# Patient Record
Sex: Male | Born: 1950
Health system: Southern US, Community
[De-identification: ages and names within clinical notes are randomized; demographics above are authoritative.]

## PROBLEM LIST (undated history)

## (undated) DIAGNOSIS — K5900 Constipation, unspecified: Secondary | ICD-10-CM

## (undated) DIAGNOSIS — I10 Essential (primary) hypertension: Secondary | ICD-10-CM

## (undated) DIAGNOSIS — K449 Diaphragmatic hernia without obstruction or gangrene: Secondary | ICD-10-CM

## (undated) DIAGNOSIS — H35349 Macular cyst, hole, or pseudohole, unspecified eye: Secondary | ICD-10-CM

## (undated) DIAGNOSIS — M1990 Unspecified osteoarthritis, unspecified site: Secondary | ICD-10-CM

## (undated) DIAGNOSIS — S93699A Other sprain of unspecified foot, initial encounter: Secondary | ICD-10-CM

## (undated) DIAGNOSIS — C801 Malignant (primary) neoplasm, unspecified: Secondary | ICD-10-CM

## (undated) DIAGNOSIS — I2510 Atherosclerotic heart disease of native coronary artery without angina pectoris: Secondary | ICD-10-CM

## (undated) DIAGNOSIS — E119 Type 2 diabetes mellitus without complications: Secondary | ICD-10-CM

## (undated) DIAGNOSIS — E7800 Pure hypercholesterolemia, unspecified: Secondary | ICD-10-CM

## (undated) HISTORY — DX: Type 2 diabetes mellitus without complications: E11.9

## (undated) HISTORY — PX: VASECTOMY: SHX75

## (undated) HISTORY — DX: Macular cyst, hole, or pseudohole, unspecified eye: H35.349

## (undated) HISTORY — DX: Atherosclerotic heart disease of native coronary artery without angina pectoris: I25.10

## (undated) HISTORY — PX: CARDIAC CATHETERIZATION: SHX172

## (undated) HISTORY — DX: Malignant (primary) neoplasm, unspecified: C80.1

## (undated) HISTORY — DX: Pure hypercholesterolemia, unspecified: E78.00

## (undated) HISTORY — DX: Essential (primary) hypertension: I10

## (undated) HISTORY — DX: Diaphragmatic hernia without obstruction or gangrene: K44.9

## (undated) HISTORY — DX: Other sprain of unspecified foot, initial encounter: S93.699A

## (undated) HISTORY — PX: APPENDECTOMY: SHX54

## (undated) HISTORY — PX: COLONOSCOPY: SHX174

---

## 1998-02-15 ENCOUNTER — Ambulatory Visit (HOSPITAL_COMMUNITY): Admission: RE | Admit: 1998-02-15 | Discharge: 1998-02-15 | Payer: Self-pay | Admitting: *Deleted

## 1998-02-15 ENCOUNTER — Encounter: Payer: Self-pay | Admitting: Surgery

## 1998-02-15 HISTORY — PX: CORONARY ARTERY BYPASS GRAFT: SHX141

## 1998-02-21 ENCOUNTER — Encounter: Payer: Self-pay | Admitting: Surgery

## 1998-02-21 ENCOUNTER — Inpatient Hospital Stay (HOSPITAL_COMMUNITY): Admission: RE | Admit: 1998-02-21 | Discharge: 1998-02-25 | Payer: Self-pay | Admitting: Surgery

## 1998-02-22 ENCOUNTER — Encounter: Payer: Self-pay | Admitting: Surgery

## 1998-02-23 ENCOUNTER — Encounter: Payer: Self-pay | Admitting: Surgery

## 1998-03-27 ENCOUNTER — Encounter (HOSPITAL_COMMUNITY): Admission: RE | Admit: 1998-03-27 | Discharge: 1998-06-25 | Payer: Self-pay | Admitting: Cardiology

## 2003-04-04 ENCOUNTER — Ambulatory Visit (HOSPITAL_COMMUNITY): Admission: RE | Admit: 2003-04-04 | Discharge: 2003-04-04 | Payer: Self-pay | Admitting: Neurosurgery

## 2003-04-06 ENCOUNTER — Encounter: Admission: RE | Admit: 2003-04-06 | Discharge: 2003-04-06 | Payer: Self-pay | Admitting: Family Medicine

## 2003-04-11 ENCOUNTER — Inpatient Hospital Stay (HOSPITAL_COMMUNITY): Admission: RE | Admit: 2003-04-11 | Discharge: 2003-04-12 | Payer: Self-pay | Admitting: Neurosurgery

## 2003-05-25 ENCOUNTER — Ambulatory Visit (HOSPITAL_COMMUNITY): Admission: RE | Admit: 2003-05-25 | Discharge: 2003-05-25 | Payer: Self-pay | Admitting: Family Medicine

## 2006-01-05 ENCOUNTER — Encounter: Admission: RE | Admit: 2006-01-05 | Discharge: 2006-01-05 | Payer: Self-pay | Admitting: Family Medicine

## 2010-08-19 ENCOUNTER — Other Ambulatory Visit: Payer: Self-pay | Admitting: Cardiology

## 2010-08-19 MED ORDER — TRANDOLAPRIL 2 MG PO TABS: 2.0000 mg | ORAL_TABLET | Freq: Every day | ORAL | Status: DC

## 2010-08-19 MED ORDER — ATORVASTATIN CALCIUM 80 MG PO TABS: 80.0000 mg | ORAL_TABLET | Freq: Every day | ORAL | Status: DC

## 2010-08-19 NOTE — Telephone Encounter (Signed)
Called wanting his prescriptions to filled with [facility] instead of his local pharmacy because he switched insurances and [facility] fills his prescriptions for 90 days. Please call back. I have pulled the chart.

## 2010-08-19 NOTE — Telephone Encounter (Signed)
Called requesting refills on maviik and lipitor. Sent to [facility]

## 2010-09-30 ENCOUNTER — Ambulatory Visit: Payer: Self-pay | Admitting: Cardiology

## 2010-10-21 ENCOUNTER — Encounter: Payer: Self-pay | Admitting: Cardiology

## 2010-10-29 ENCOUNTER — Ambulatory Visit (INDEPENDENT_AMBULATORY_CARE_PROVIDER_SITE_OTHER): Payer: 59 | Admitting: Cardiology

## 2010-10-29 ENCOUNTER — Encounter: Payer: Self-pay | Admitting: Cardiology

## 2010-10-29 DIAGNOSIS — E119 Type 2 diabetes mellitus without complications: Secondary | ICD-10-CM | POA: Insufficient documentation

## 2010-10-29 DIAGNOSIS — I10 Essential (primary) hypertension: Secondary | ICD-10-CM

## 2010-10-29 DIAGNOSIS — I2510 Atherosclerotic heart disease of native coronary artery without angina pectoris: Secondary | ICD-10-CM

## 2010-10-29 DIAGNOSIS — E7800 Pure hypercholesterolemia, unspecified: Secondary | ICD-10-CM

## 2010-10-29 NOTE — Progress Notes (Signed)
   Kevin Wheeler Date of Birth: 10/13/1950   History of Present Illness: Kevin Wheeler is seen today for yearly followup. He states he has done very well over the past year. He denies any symptoms of chest pain or shortness of breath. He just returned from a 3-1/2 week vacation to [place]. He did a lot of walking there and had no complaints. He has had no other significant medical problems this past year.  Current Outpatient Prescriptions on File Prior to Visit  Medication Sig Dispense Refill  . aspirin 81 MG tablet Take 81 mg by mouth daily.        Marland Kitchen atorvastatin (LIPITOR) 80 MG tablet Take 1 tablet (80 mg total) by mouth daily.  90 tablet  3  . trandolapril (MAVIK) 2 MG tablet Take 1 tablet (2 mg total) by mouth daily.  90 tablet  3  . ezetimibe (ZETIA) 10 MG tablet Take 10 mg by mouth daily.          No Known Allergies  Past Medical History  Diagnosis Date  . Coronary artery disease   . Hypertension   . Hypercholesteremia   . Diabetes mellitus   . Hiatal hernia   . Cancer     Skin Cancers    Past Surgical History  Procedure Date  . Colonoscopy   . Appendectomy   . Vasectomy   . Coronary artery bypass graft February 15, 1998    Included an LIMA graft to the LAD, saphenous vein graft sequentially to the intermediate and obtuse marginal vessels, and a sequential vein graft to the acute marginal and distal circumflex. There is also a vein graft to the diagonal  . Cardiac catheterization     Ejection fraction is estimated at 60%    History  Smoking status  . Never Smoker   Smokeless tobacco  . Not on file    History  Alcohol Use No    Family History  Problem Relation Age of Onset  . Heart disease Father     Review of Systems: The review of systems is positive for mild weight gain.  All other systems were reviewed and are negative.  Physical Exam: BP 126/82  Ht 5\' 9"  (1.753 m)  Wt 195 lb 3.2 oz (88.542 kg)  BMI 28.83 kg/m2 The patient is alert and oriented x 3.  The  mood and affect are normal.  The skin is warm and dry.  Color is normal.  The HEENT exam reveals that the sclera are nonicteric.  The mucous membranes are moist.  The carotids are 2+ without bruits.  There is no thyromegaly.  There is no JVD.  The lungs are clear.  The chest wall is non tender.  The heart exam reveals a regular rate with a normal S1 and S2.  There are no murmurs, gallops, or rubs.  The PMI is not displaced.   Abdominal exam reveals good bowel sounds.  There is no guarding or rebound.  There is no hepatosplenomegaly or tenderness.  There are no masses.  Exam of the legs reveal no clubbing, cyanosis, or edema.  The legs are without rashes.  The distal pulses are intact.  Cranial nerves II - XII are intact.  Motor and sensory functions are intact.  The gait is normal. LABORATORY DATA: ECG today is normal.  Assessment / Plan:

## 2010-10-29 NOTE — Assessment & Plan Note (Signed)
Blood pressure is under excellent control today. We'll continue his current therapy.

## 2010-10-29 NOTE — Patient Instructions (Signed)
Continue your current medications.  I will get a copy of your lab from Dr. Clovis Riley.  I will see you again in 1 year.

## 2010-10-29 NOTE — Assessment & Plan Note (Signed)
Will obtain a copy of his most recent lab work from Dr. Clovis Riley. He will continue on Lipitor therapy.

## 2010-10-29 NOTE — Assessment & Plan Note (Signed)
His last stress test from a year ago was normal. He is asymptomatic. We will continue his risk factor modification and followup again in one year.

## 2010-10-30 ENCOUNTER — Encounter: Payer: Self-pay | Admitting: Cardiology

## 2011-03-10 ENCOUNTER — Encounter: Payer: Self-pay | Admitting: Cardiology

## 2011-10-20 ENCOUNTER — Other Ambulatory Visit: Payer: Self-pay | Admitting: Neurosurgery

## 2011-10-20 DIAGNOSIS — M542 Cervicalgia: Secondary | ICD-10-CM

## 2011-10-21 ENCOUNTER — Ambulatory Visit
Admission: RE | Admit: 2011-10-21 | Discharge: 2011-10-21 | Disposition: A | Discharge status: Home / Self Care | Payer: 59 | Source: Ambulatory Visit | Attending: Neurosurgery | Admitting: Neurosurgery

## 2011-10-21 VITALS — BP 119/81 | HR 73

## 2011-10-21 DIAGNOSIS — M542 Cervicalgia: Secondary | ICD-10-CM

## 2011-10-21 MED ORDER — DIAZEPAM 5 MG PO TABS
10.0000 mg | ORAL_TABLET | Freq: Once | ORAL | Status: AC
  Administered 2011-10-21: 10 mg via ORAL

## 2011-10-21 MED ORDER — IOHEXOL 300 MG/ML  SOLN
10.0000 mL | Freq: Once | INTRAMUSCULAR | Status: AC | PRN
  Administered 2011-10-21: 10 mL via INTRATHECAL

## 2011-10-23 ENCOUNTER — Encounter (HOSPITAL_COMMUNITY): Payer: Self-pay | Admitting: Pharmacy Technician

## 2011-10-23 ENCOUNTER — Other Ambulatory Visit: Payer: Self-pay | Admitting: Neurosurgery

## 2011-10-23 ENCOUNTER — Encounter (HOSPITAL_COMMUNITY): Payer: Self-pay | Admitting: *Deleted

## 2011-10-24 ENCOUNTER — Encounter (HOSPITAL_COMMUNITY): Payer: Self-pay | Admitting: Anesthesiology

## 2011-10-24 ENCOUNTER — Encounter (HOSPITAL_COMMUNITY)
Admission: RE | Disposition: A | Discharge status: Home / Self Care | Payer: Self-pay | Source: Ambulatory Visit | Attending: Neurosurgery

## 2011-10-24 ENCOUNTER — Observation Stay (HOSPITAL_COMMUNITY)
Admission: RE | Admit: 2011-10-24 | Discharge: 2011-10-25 | DRG: 030 | Disposition: A | Discharge status: Home / Self Care | Payer: 59 | Source: Ambulatory Visit | Attending: Neurosurgery | Admitting: Neurosurgery

## 2011-10-24 ENCOUNTER — Ambulatory Visit (HOSPITAL_COMMUNITY): Payer: 59

## 2011-10-24 ENCOUNTER — Ambulatory Visit (HOSPITAL_COMMUNITY): Payer: 59 | Admitting: Anesthesiology

## 2011-10-24 ENCOUNTER — Other Ambulatory Visit: Payer: Self-pay | Admitting: Cardiology

## 2011-10-24 DIAGNOSIS — Q762 Congenital spondylolisthesis: Principal | ICD-10-CM | POA: Insufficient documentation

## 2011-10-24 DIAGNOSIS — I2510 Atherosclerotic heart disease of native coronary artery without angina pectoris: Secondary | ICD-10-CM | POA: Insufficient documentation

## 2011-10-24 DIAGNOSIS — Z951 Presence of aortocoronary bypass graft: Secondary | ICD-10-CM | POA: Insufficient documentation

## 2011-10-24 DIAGNOSIS — M47812 Spondylosis without myelopathy or radiculopathy, cervical region: Secondary | ICD-10-CM | POA: Insufficient documentation

## 2011-10-24 DIAGNOSIS — Z981 Arthrodesis status: Secondary | ICD-10-CM | POA: Insufficient documentation

## 2011-10-24 DIAGNOSIS — I10 Essential (primary) hypertension: Secondary | ICD-10-CM | POA: Insufficient documentation

## 2011-10-24 DIAGNOSIS — E119 Type 2 diabetes mellitus without complications: Secondary | ICD-10-CM | POA: Insufficient documentation

## 2011-10-24 HISTORY — DX: Unspecified osteoarthritis, unspecified site: M19.90

## 2011-10-24 HISTORY — DX: Constipation, unspecified: K59.00

## 2011-10-24 HISTORY — PX: ANTERIOR CERVICAL DECOMP/DISCECTOMY FUSION: SHX1161

## 2011-10-24 LAB — CBC
HCT: 45.1 % (ref 39.0–52.0)
Hemoglobin: 15.6 g/dL (ref 13.0–17.0)
MCH: 31.4 pg (ref 26.0–34.0)
MCHC: 34.6 g/dL (ref 30.0–36.0)
RDW: 13.4 % (ref 11.5–15.5)

## 2011-10-24 LAB — BASIC METABOLIC PANEL
BUN: 29 mg/dL — ABNORMAL HIGH (ref 6–23)
Chloride: 99 mEq/L (ref 96–112)
Creatinine, Ser: 1.04 mg/dL (ref 0.50–1.35)
GFR calc Af Amer: 86 mL/min — ABNORMAL LOW (ref >90)
GFR calc non Af Amer: 74 mL/min — ABNORMAL LOW (ref >90)
Glucose, Bld: 170 mg/dL — ABNORMAL HIGH (ref 70–99)
Potassium: 3.7 mEq/L (ref 3.5–5.1)

## 2011-10-24 LAB — GLUCOSE, CAPILLARY: Glucose-Capillary: 149 mg/dL — ABNORMAL HIGH (ref 70–99)

## 2011-10-24 SURGERY — ANTERIOR CERVICAL DECOMPRESSION/DISCECTOMY FUSION 1 LEVEL
Anesthesia: General | Site: Neck

## 2011-10-24 MED ORDER — MUPIROCIN 2 % EX OINT
TOPICAL_OINTMENT | Freq: Two times a day (BID) | CUTANEOUS | Status: DC
  Filled 2011-10-24: qty 22

## 2011-10-24 MED ORDER — HYDROMORPHONE HCL PF 1 MG/ML IJ SOLN
INTRAMUSCULAR | Status: AC
  Administered 2011-10-24: 0.5 mg via INTRAVENOUS
  Filled 2011-10-24: qty 1

## 2011-10-24 MED ORDER — ACETAMINOPHEN 325 MG PO TABS: 650.0000 mg | ORAL_TABLET | ORAL | Status: DC | PRN

## 2011-10-24 MED ORDER — NEOSTIGMINE METHYLSULFATE 1 MG/ML IJ SOLN
INTRAMUSCULAR | Status: DC | PRN
  Administered 2011-10-24: 2.5 mg via INTRAVENOUS

## 2011-10-24 MED ORDER — THROMBIN 5000 UNITS EX SOLR
OROMUCOSAL | Status: DC | PRN
  Administered 2011-10-24: 20:00:00 via TOPICAL

## 2011-10-24 MED ORDER — DEXAMETHASONE 4 MG PO TABS
4.0000 mg | ORAL_TABLET | Freq: Four times a day (QID) | ORAL | Status: DC
  Administered 2011-10-25 (×3): 4 mg via ORAL
  Filled 2011-10-24 (×6): qty 1

## 2011-10-24 MED ORDER — GLYCOPYRROLATE 0.2 MG/ML IJ SOLN
INTRAMUSCULAR | Status: DC | PRN
  Administered 2011-10-24: .5 mg via INTRAVENOUS

## 2011-10-24 MED ORDER — SODIUM CHLORIDE 0.9 % IV SOLN: INTRAVENOUS | Status: DC

## 2011-10-24 MED ORDER — ROCURONIUM BROMIDE 100 MG/10ML IV SOLN
INTRAVENOUS | Status: DC | PRN
  Administered 2011-10-24: 10 mg via INTRAVENOUS
  Administered 2011-10-24: 50 mg via INTRAVENOUS
  Administered 2011-10-24 (×2): 10 mg via INTRAVENOUS

## 2011-10-24 MED ORDER — DIAZEPAM 5 MG PO TABS: 5.0000 mg | ORAL_TABLET | Freq: Four times a day (QID) | ORAL | Status: DC | PRN

## 2011-10-24 MED ORDER — ATORVASTATIN CALCIUM 80 MG PO TABS
80.0000 mg | ORAL_TABLET | Freq: Every day | ORAL | Status: DC
  Filled 2011-10-24 (×3): qty 1

## 2011-10-24 MED ORDER — FENTANYL CITRATE 0.05 MG/ML IJ SOLN
INTRAMUSCULAR | Status: DC | PRN
  Administered 2011-10-24 (×6): 50 ug via INTRAVENOUS
  Administered 2011-10-24: 100 ug via INTRAVENOUS

## 2011-10-24 MED ORDER — OXYCODONE HCL 5 MG/5ML PO SOLN: 5.0000 mg | Freq: Once | ORAL | Status: DC | PRN

## 2011-10-24 MED ORDER — ONDANSETRON HCL 4 MG/2ML IJ SOLN
INTRAMUSCULAR | Status: DC | PRN
  Administered 2011-10-24: 4 mg via INTRAVENOUS

## 2011-10-24 MED ORDER — CEFAZOLIN SODIUM-DEXTROSE 2-3 GM-% IV SOLR
INTRAVENOUS | Status: DC | PRN
  Administered 2011-10-24: 2 g via INTRAVENOUS

## 2011-10-24 MED ORDER — CEFAZOLIN SODIUM-DEXTROSE 2-3 GM-% IV SOLR
INTRAVENOUS | Status: AC
  Filled 2011-10-24: qty 50

## 2011-10-24 MED ORDER — MUPIROCIN 2 % EX OINT
TOPICAL_OINTMENT | CUTANEOUS | Status: AC
  Administered 2011-10-24: 1 via NASAL
  Filled 2011-10-24: qty 22

## 2011-10-24 MED ORDER — ACETAMINOPHEN 650 MG RE SUPP: 650.0000 mg | RECTAL | Status: DC | PRN

## 2011-10-24 MED ORDER — LIDOCAINE HCL (CARDIAC) 20 MG/ML IV SOLN
INTRAVENOUS | Status: DC | PRN
  Administered 2011-10-24: 70 mg via INTRAVENOUS

## 2011-10-24 MED ORDER — CEFAZOLIN SODIUM 1-5 GM-% IV SOLN
1.0000 g | Freq: Three times a day (TID) | INTRAVENOUS | Status: AC
  Administered 2011-10-25 (×2): 1 g via INTRAVENOUS
  Filled 2011-10-24 (×2): qty 50

## 2011-10-24 MED ORDER — DEXAMETHASONE SODIUM PHOSPHATE 4 MG/ML IJ SOLN
4.0000 mg | Freq: Four times a day (QID) | INTRAMUSCULAR | Status: DC
  Filled 2011-10-24 (×4): qty 1

## 2011-10-24 MED ORDER — HYDROMORPHONE HCL PF 1 MG/ML IJ SOLN
0.2500 mg | INTRAMUSCULAR | Status: DC | PRN
  Administered 2011-10-24 (×2): 0.5 mg via INTRAVENOUS

## 2011-10-24 MED ORDER — 0.9 % SODIUM CHLORIDE (POUR BTL) OPTIME
TOPICAL | Status: DC | PRN
  Administered 2011-10-24: 1000 mL

## 2011-10-24 MED ORDER — SODIUM CHLORIDE 0.9 % IJ SOLN: 3.0000 mL | INTRAMUSCULAR | Status: DC | PRN

## 2011-10-24 MED ORDER — DEXAMETHASONE SODIUM PHOSPHATE 4 MG/ML IJ SOLN
INTRAMUSCULAR | Status: DC | PRN
  Administered 2011-10-24 (×2): 4 mg via INTRAVENOUS

## 2011-10-24 MED ORDER — PROPOFOL 10 MG/ML IV EMUL
INTRAVENOUS | Status: DC | PRN
  Administered 2011-10-24: 200 mg via INTRAVENOUS

## 2011-10-24 MED ORDER — OXYCODONE-ACETAMINOPHEN 5-325 MG PO TABS: 1.0000 | ORAL_TABLET | ORAL | Status: DC | PRN

## 2011-10-24 MED ORDER — ZOLPIDEM TARTRATE 10 MG PO TABS: 10.0000 mg | ORAL_TABLET | Freq: Every evening | ORAL | Status: DC | PRN

## 2011-10-24 MED ORDER — LACTATED RINGERS IV SOLN
INTRAVENOUS | Status: DC | PRN
  Administered 2011-10-24 (×2): via INTRAVENOUS

## 2011-10-24 MED ORDER — HEMOSTATIC AGENTS (NO CHARGE) OPTIME
TOPICAL | Status: DC | PRN
  Administered 2011-10-24: 1 via TOPICAL

## 2011-10-24 MED ORDER — PHENOL 1.4 % MT LIQD
1.0000 | OROMUCOSAL | Status: DC | PRN
  Filled 2011-10-24: qty 177

## 2011-10-24 MED ORDER — MENTHOL 3 MG MT LOZG
1.0000 | LOZENGE | OROMUCOSAL | Status: DC | PRN
  Filled 2011-10-24: qty 9

## 2011-10-24 MED ORDER — ONDANSETRON HCL 4 MG/2ML IJ SOLN: 4.0000 mg | INTRAMUSCULAR | Status: DC | PRN

## 2011-10-24 MED ORDER — SODIUM CHLORIDE 0.9 % IJ SOLN
3.0000 mL | Freq: Two times a day (BID) | INTRAMUSCULAR | Status: DC
  Administered 2011-10-25: 3 mL via INTRAVENOUS

## 2011-10-24 MED ORDER — DROPERIDOL 2.5 MG/ML IJ SOLN
INTRAMUSCULAR | Status: DC | PRN
  Administered 2011-10-24: 0.625 mg via INTRAVENOUS

## 2011-10-24 MED ORDER — TRANDOLAPRIL 2 MG PO TABS
2.0000 mg | ORAL_TABLET | Freq: Every day | ORAL | Status: DC
  Administered 2011-10-25: 2 mg via ORAL
  Filled 2011-10-24 (×3): qty 1

## 2011-10-24 MED ORDER — THROMBIN 5000 UNITS EX SOLR
CUTANEOUS | Status: DC | PRN
  Administered 2011-10-24 (×2): 5000 [IU] via TOPICAL

## 2011-10-24 MED ORDER — SODIUM CHLORIDE 0.9 % IV SOLN: 250.0000 mL | INTRAVENOUS | Status: DC

## 2011-10-24 MED ORDER — MIDAZOLAM HCL 5 MG/5ML IJ SOLN
INTRAMUSCULAR | Status: DC | PRN
  Administered 2011-10-24: 2 mg via INTRAVENOUS

## 2011-10-24 MED ORDER — OXYCODONE HCL 5 MG PO TABS: 5.0000 mg | ORAL_TABLET | Freq: Once | ORAL | Status: DC | PRN

## 2011-10-24 SURGICAL SUPPLY — 54 items
APL SKNCLS STERI-STRIP NONHPOA (GAUZE/BANDAGES/DRESSINGS) ×1
BANDAGE GAUZE ELAST BULKY 4 IN (GAUZE/BANDAGES/DRESSINGS) ×2 IMPLANT
BENZOIN TINCTURE PRP APPL 2/3 (GAUZE/BANDAGES/DRESSINGS) ×2 IMPLANT
BLADE ULTRA TIP 2M (BLADE) ×2 IMPLANT
BUR BARREL STRAIGHT FLUTE 4.0 (BURR) IMPLANT
BUR MATCHSTICK NEURO 3.0 LAGG (BURR) ×2 IMPLANT
CANISTER SUCTION 2500CC (MISCELLANEOUS) ×2 IMPLANT
CLOTH BEACON ORANGE TIMEOUT ST (SAFETY) ×2 IMPLANT
CONT SPEC 4OZ CLIKSEAL STRL BL (MISCELLANEOUS) ×2 IMPLANT
COVER MAYO STAND STRL (DRAPES) ×2 IMPLANT
Coalition 12x14 0 degree 8 MM ×1 IMPLANT
DRAPE C-ARM 42X72 X-RAY (DRAPES) ×2 IMPLANT
DRAPE LAPAROTOMY 100X72 PEDS (DRAPES) ×2 IMPLANT
DRAPE MICROSCOPE LEICA (MISCELLANEOUS) ×2 IMPLANT
DRAPE POUCH INSTRU U-SHP 10X18 (DRAPES) ×2 IMPLANT
DURAPREP 6ML APPLICATOR 50/CS (WOUND CARE) ×2 IMPLANT
ELECT REM PT RETURN 9FT ADLT (ELECTROSURGICAL) ×2
ELECTRODE REM PT RTRN 9FT ADLT (ELECTROSURGICAL) ×1 IMPLANT
GAUZE SPONGE 4X4 16PLY XRAY LF (GAUZE/BANDAGES/DRESSINGS) IMPLANT
GLOVE BIO SURGEON STRL SZ 6.5 (GLOVE) ×3 IMPLANT
GLOVE BIOGEL M 8.0 STRL (GLOVE) ×2 IMPLANT
GLOVE BIOGEL PI IND STRL 7.0 (GLOVE) IMPLANT
GLOVE BIOGEL PI INDICATOR 7.0 (GLOVE) ×2
GLOVE EXAM NITRILE LRG STRL (GLOVE) IMPLANT
GLOVE EXAM NITRILE MD LF STRL (GLOVE) IMPLANT
GLOVE EXAM NITRILE XL STR (GLOVE) IMPLANT
GLOVE EXAM NITRILE XS STR PU (GLOVE) IMPLANT
GLOVE SURG SS PI 6.5 STRL IVOR (GLOVE) ×1 IMPLANT
GOWN BRE IMP SLV AUR LG STRL (GOWN DISPOSABLE) ×4 IMPLANT
GOWN BRE IMP SLV AUR XL STRL (GOWN DISPOSABLE) ×1 IMPLANT
GOWN STRL REIN 2XL LVL4 (GOWN DISPOSABLE) IMPLANT
HEAD HALTER (SOFTGOODS) ×2 IMPLANT
HEMOSTAT POWDER KIT SURGIFOAM (HEMOSTASIS) ×1 IMPLANT
KIT BASIN OR (CUSTOM PROCEDURE TRAY) ×2 IMPLANT
KIT ROOM TURNOVER OR (KITS) ×2 IMPLANT
NDL SPNL 22GX3.5 QUINCKE BK (NEEDLE) IMPLANT
NEEDLE SPNL 22GX3.5 QUINCKE BK (NEEDLE) ×2 IMPLANT
NS IRRIG 1000ML POUR BTL (IV SOLUTION) ×2 IMPLANT
PACK LAMINECTOMY NEURO (CUSTOM PROCEDURE TRAY) ×2 IMPLANT
PATTIES SURGICAL .5 X1 (DISPOSABLE) ×1 IMPLANT
PUTTY BONE GRAFT KIT 2.5ML (Bone Implant) ×1 IMPLANT
RUBBERBAND STERILE (MISCELLANEOUS) ×4 IMPLANT
SCREW VAR SELF TAP 3.6MM 14MM (Screw) ×2 IMPLANT
SPONGE GAUZE 4X4 12PLY (GAUZE/BANDAGES/DRESSINGS) ×2 IMPLANT
SPONGE INTESTINAL PEANUT (DISPOSABLE) ×3 IMPLANT
SPONGE SURGIFOAM ABS GEL SZ50 (HEMOSTASIS) ×2 IMPLANT
STRIP CLOSURE SKIN 1/2X4 (GAUZE/BANDAGES/DRESSINGS) ×2 IMPLANT
SUT VIC AB 3-0 SH 8-18 (SUTURE) ×2 IMPLANT
SUT VICRYL 4-0 PS2 18IN ABS (SUTURE) ×1 IMPLANT
SYR 20ML ECCENTRIC (SYRINGE) ×2 IMPLANT
TAPE CLOTH SURG 4X10 WHT LF (GAUZE/BANDAGES/DRESSINGS) ×1 IMPLANT
TOWEL OR 17X24 6PK STRL BLUE (TOWEL DISPOSABLE) ×2 IMPLANT
TOWEL OR 17X26 10 PK STRL BLUE (TOWEL DISPOSABLE) ×2 IMPLANT
WATER STERILE IRR 1000ML POUR (IV SOLUTION) ×2 IMPLANT

## 2011-10-24 NOTE — H&P (Signed)
Kevin Wheeler is an 61 y.o. male.   Chief Complaint: left shoulder pain HPI: pain  In neck with radiation to the left shoulder associated with decrease mobility. Patient had 3 rounds od po cortisone with no improvement. Dad a cervical myelogram as outpatient   Past Medical History  Diagnosis Date  . Coronary artery disease   . Hypertension   . Hypercholesteremia   . Hiatal hernia   . Diabetes mellitus type 2, noninsulin dependent     Diet controlled.  . Constipation   . Cancer     Skin Cancers- Basal cell 3 arm 1 forehead  . Arthritis     Past Surgical History  Procedure Date  . Colonoscopy   . Appendectomy   . Vasectomy   . Cardiac catheterization     Ejection fraction is estimated at 60%  . Coronary artery bypass graft February 15, 1998    Included an LIMA graft to the LAD, saphenous vein graft sequentially to the intermediate and obtuse marginal vessels, and a sequential vein graft to the acute marginal and distal circumflex. There is also a vein graft to the diagonal    Family History  Problem Relation Age of Onset  . Heart disease Father    Social History:  reports that he has never smoked. He does not have any smokeless tobacco history on file. He reports that he drinks about 4.8 ounces of alcohol per week. He reports that he does not use illicit drugs.  Allergies: No Known Allergies  Medications Prior to Admission  Medication Sig Dispense Refill  . atorvastatin (LIPITOR) 80 MG tablet Take 80 mg by mouth daily.      . cyclobenzaprine (FLEXERIL) 5 MG tablet Take 5 mg by mouth 3 (three) times daily as needed. Muscle spasm      . HYDROcodone-acetaminophen (VICODIN) 5-500 MG per tablet Take 1 tablet by mouth every 4 (four) hours as needed. pain      . predniSONE (DELTASONE) 10 MG tablet Take 10 mg by mouth daily. Patient is on a six day taper started on 10/21/11      . trandolapril (MAVIK) 2 MG tablet Take 2 mg by mouth daily.      Marland Kitchen aspirin 81 MG tablet Take 81 mg by  mouth daily.          Results for orders placed during the hospital encounter of 10/24/11 (from the past 48 hour(s))  BASIC METABOLIC PANEL     Status: Abnormal   Collection Time   10/24/11  1:56 PM      Component Value Range Comment   Sodium 139  135 - 145 mEq/L    Potassium 3.7  3.5 - 5.1 mEq/L    Chloride 99  96 - 112 mEq/L    CO2 27  19 - 32 mEq/L    Glucose, Bld 170 (*) 70 - 99 mg/dL    BUN 29 (*) 6 - 23 mg/dL    Creatinine, Ser 8.29  0.50 - 1.35 mg/dL    Calcium 56.2  8.4 - 10.5 mg/dL    GFR calc non Af Amer 74 (*) >90 mL/min    GFR calc Af Amer 86 (*) >90 mL/min   CBC     Status: Abnormal   Collection Time   10/24/11  1:56 PM      Component Value Range Comment   WBC 20.6 (*) 4.0 - 10.5 K/uL    RBC 4.97  4.22 - 5.81 MIL/uL    Hemoglobin  15.6  13.0 - 17.0 g/dL    HCT 45.4  09.8 - 11.9 %    MCV 90.7  78.0 - 100.0 fL    MCH 31.4  26.0 - 34.0 pg    MCHC 34.6  30.0 - 36.0 g/dL    RDW 14.7  82.9 - 56.2 %    Platelets 183  150 - 400 K/uL   SURGICAL PCR SCREEN     Status: Normal   Collection Time   10/24/11  1:58 PM      Component Value Range Comment   MRSA, PCR NEGATIVE  NEGATIVE    Staphylococcus aureus NEGATIVE  NEGATIVE    Dg Chest 2 View  10/24/2011  *RADIOLOGY REPORT*  Clinical Data: Preoperative assessment for cervical decompression, history hypertension, diabetes, CABG  CHEST - 2 VIEW  Comparison: 04/10/2003  Findings: Upper normal heart size post CABG. Mediastinal contours and pulmonary vascularity normal. Lungs clear. No pleural effusion or pneumothorax. Prior cervical spine fusion.  IMPRESSION: Post CABG. No acute abnormalities.   Original Report Authenticated By: Lollie Marrow, M.D.     Review of Systems  Constitutional: Negative.   HENT: Positive for neck pain.   Eyes: Negative.   Respiratory: Negative.   Cardiovascular: Negative.   Gastrointestinal: Negative.   Genitourinary: Negative.   Skin: Negative.   Neurological: Positive for headaches.    Endo/Heme/Allergies: Negative.   Psychiatric/Behavioral: Negative.     Blood pressure 169/96, pulse 83, temperature 97.3 F (36.3 C), temperature source Oral, resp. rate 18, height 5\' 9"  (1.753 m), weight 82.5 kg (181 lb 14.1 oz), SpO2 99.00%. Physical Exam hent,nl. Neck, anterior scar. Pain with flexibility.lungs, clear. Cv, midline scar. Lungs, clear. Abdomen, nl. Extremities, nl. No pain in shoulders. Good mobility. NEURO sensory, nl.strengh nl  Cervical myelogram no hnp. cerviacl spondylolisthesis at c34 Assessment/Plan degenerative spondyliolisthesis  At c34 with c4 radiculopathy. Patient to undergo anterior c34 decompression and fusion. He and his wife are aware of risks and benefits  David Rodriquez M 10/24/2011, 5:42 PM

## 2011-10-24 NOTE — Preoperative (Signed)
Beta Blockers   Reason not to administer Beta Blockers:Not Applicable 

## 2011-10-24 NOTE — Anesthesia Procedure Notes (Signed)
Procedure Name: Intubation Date/Time: 10/24/2011 6:18 PM Performed by: Gwenyth Allegra Pre-anesthesia Checklist: Emergency Drugs available, Patient identified, Timeout performed, Suction available and Patient being monitored Patient Re-evaluated:Patient Re-evaluated prior to inductionOxygen Delivery Method: Circle system utilized Preoxygenation: Pre-oxygenation with 100% oxygen Intubation Type: IV induction Ventilation: Mask ventilation without difficulty and Oral airway inserted - appropriate to patient size Laryngoscope Size: Mac and 4 Tube type: Oral Tube size: 7.5 mm Number of attempts: 1 Airway Equipment and Method: Stylet Placement Confirmation: ETT inserted through vocal cords under direct vision Secured at: 23 cm Tube secured with: Tape Dental Injury: Teeth and Oropharynx as per pre-operative assessment

## 2011-10-24 NOTE — Anesthesia Preprocedure Evaluation (Signed)
Anesthesia Evaluation  Patient identified by MRN, date of birth, ID band Patient awake    Reviewed: Allergy & Precautions, H&P , NPO status , Patient's Chart, lab work & pertinent test results  Airway Mallampati: II TM Distance: >3 FB Neck ROM: Full    Dental No notable dental hx. (+) Teeth Intact and Dental Advisory Given   Pulmonary neg pulmonary ROS,  breath sounds clear to auscultation  Pulmonary exam normal       Cardiovascular hypertension, On Medications + CAD Rhythm:Regular Rate:Normal     Neuro/Psych negative neurological ROS  negative psych ROS   GI/Hepatic Neg liver ROS, hiatal hernia,   Endo/Other  Well Controlled  Renal/GU negative Renal ROS  negative genitourinary   Musculoskeletal   Abdominal   Peds  Hematology negative hematology ROS (+)   Anesthesia Other Findings   Reproductive/Obstetrics negative OB ROS                           Anesthesia Physical Anesthesia Plan  ASA: III  Anesthesia Plan: General   Post-op Pain Management:    Induction: Intravenous  Airway Management Planned: Oral ETT  Additional Equipment:   Intra-op Plan:   Post-operative Plan: Extubation in OR  Informed Consent: I have reviewed the patients History and Physical, chart, labs and discussed the procedure including the risks, benefits and alternatives for the proposed anesthesia with the patient or authorized representative who has indicated his/her understanding and acceptance.   Dental advisory given  Plan Discussed with: CRNA  Anesthesia Plan Comments:         Anesthesia Quick Evaluation

## 2011-10-24 NOTE — Telephone Encounter (Signed)
Pt needs appointment then refill can be made Fax Received. Refill Completed. Fay Swider Chowoe (R.M.A)   

## 2011-10-24 NOTE — Anesthesia Postprocedure Evaluation (Signed)
Anesthesia Post Note  Patient: Kevin Wheeler  Procedure(s) Performed: Procedure(s) (LRB): ANTERIOR CERVICAL DECOMPRESSION/DISCECTOMY FUSION 1 LEVEL (N/A)  Anesthesia type: general  Patient location: PACU  Post pain: Pain level controlled  Post assessment: Patient's Cardiovascular Status Stable  Last Vitals:  Filed Vitals:   10/24/11 2115  BP: 159/93  Pulse: 76  Temp:   Resp: 16    Post vital signs: Reviewed and stable  Level of consciousness: sedated  Complications: No apparent anesthesia complications

## 2011-10-24 NOTE — Transfer of Care (Signed)
Immediate Anesthesia Transfer of Care Note  Patient: Kevin Wheeler  Procedure(s) Performed: Procedure(s) (LRB): ANTERIOR CERVICAL DECOMPRESSION/DISCECTOMY FUSION 1 LEVEL (N/A)  Patient Location: PACU  Anesthesia Type: General  Level of Consciousness: awake, alert  and oriented  Airway & Oxygen Therapy: Patient Spontanous Breathing and Patient connected to nasal cannula oxygen  Post-op Assessment: Report given to PACU RN, Post -op Vital signs reviewed and stable and Patient moving all extremities X 4  Post vital signs: Reviewed and stable  Complications: No apparent anesthesia complications

## 2011-10-25 ENCOUNTER — Encounter (HOSPITAL_COMMUNITY): Payer: Self-pay | Admitting: *Deleted

## 2011-10-25 LAB — GLUCOSE, CAPILLARY
Glucose-Capillary: 278 mg/dL — ABNORMAL HIGH (ref 70–99)
Glucose-Capillary: 343 mg/dL — ABNORMAL HIGH (ref 70–99)

## 2011-10-25 NOTE — Discharge Summary (Signed)
Physician Discharge Summary  Patient ID: EERO DINI MRN: [id number] DOB/AGE: 1950/10/31 61 y.o.  Admit date: 10/24/2011 Discharge date: 10/25/2011  Admission Diagnoses:  Discharge Diagnoses:  Active Problems:  * No active hospital problems. *    Discharged Condition: good  Hospital Course: Uncomplicated Anterior cervical decompression and fusion C3/4   Consults: None  Significant Diagnostic Studies: None  Treatments: surgery: Uncomplicated Anterior cervical decompression and fusion C3/4   Discharge Exam: Blood pressure 128/81, pulse 96, temperature 97.9 F (36.6 C), temperature source Oral, resp. rate 18, height 5\' 9"  (1.753 m), weight 83.6 kg (184 lb 4.9 oz), SpO2 96.00%. Neurologic: Alert and oriented X 3, normal strength and tone. Normal symmetric reflexes. Normal coordination and gait Wound: CDI  Disposition: Home   Medication List  As of 10/25/2011 11:41 AM   STOP taking these medications         predniSONE 10 MG tablet         TAKE these medications         aspirin 81 MG tablet   Take 81 mg by mouth daily.      atorvastatin 80 MG tablet   Commonly known as: LIPITOR   TAKE 1 TABLET DAILY      atorvastatin 80 MG tablet   Commonly known as: LIPITOR   Take 80 mg by mouth daily.      cyclobenzaprine 5 MG tablet   Commonly known as: FLEXERIL   Take 5 mg by mouth 3 (three) times daily as needed. Muscle spasm      HYDROcodone-acetaminophen 5-500 MG per tablet   Commonly known as: VICODIN   Take 1 tablet by mouth every 4 (four) hours as needed. pain      trandolapril 2 MG tablet   Commonly known as: MAVIK   TAKE 1 TABLET DAILY      trandolapril 2 MG tablet   Commonly known as: MAVIK   Take 2 mg by mouth daily.             Signed: Dorian Heckle, MD 10/25/2011, 11:41 AM

## 2011-10-25 NOTE — Progress Notes (Signed)
Pt was given discharge orders with  Understanding of FU care S/S of infections when to call MD

## 2011-10-25 NOTE — Plan of Care (Signed)
Problem: Consults Goal: Diagnosis - Spinal Surgery Cervical Spine Fusion one level C3-C4

## 2011-10-25 NOTE — Progress Notes (Signed)
Subjective: Patient reports feeling much better  Objective: Vital signs in last 24 hours: Temp:  [97.3 F (36.3 C)-98 F (36.7 C)] 97.9 F (36.6 C) (08/31 1011) Pulse Rate:  [74-100] 96  (08/31 1011) Resp:  [12-18] 18  (08/31 1011) BP: (111-169)/(64-96) 128/81 mmHg (08/31 1011) SpO2:  [95 %-100 %] 96 % (08/31 1011) Weight:  [82.5 kg (181 lb 14.1 oz)-83.6 kg (184 lb 4.9 oz)] 83.6 kg (184 lb 4.9 oz) (08/30 2200)  Intake/Output from previous day: 08/30 0701 - 08/31 0700 In: 1700 [I.V.:1700] Out: 125 [Blood:125] Intake/Output this shift:    Physical Exam: Strength full, dressing CDI.  Lab Results:  Basename 10/24/11 1356  WBC 20.6*  HGB 15.6  HCT 45.1  PLT 183   BMET  Basename 10/24/11 1356  NA 139  K 3.7  CL 99  CO2 27  GLUCOSE 170*  BUN 29*  CREATININE 1.04  CALCIUM 10.3    Studies/Results: Dg Chest 2 View  10/24/2011  *RADIOLOGY REPORT*  Clinical Data: Preoperative assessment for cervical decompression, history hypertension, diabetes, CABG  CHEST - 2 VIEW  Comparison: 04/10/2003  Findings: Upper normal heart size post CABG. Mediastinal contours and pulmonary vascularity normal. Lungs clear. No pleural effusion or pneumothorax. Prior cervical spine fusion.  IMPRESSION: Post CABG. No acute abnormalities.   Original Report Authenticated By: Lollie Marrow, M.D.    Dg Cervical Spine 1 View  10/25/2011  *RADIOLOGY REPORT*  Clinical Data: Anterior cervical fusion.  C-ARM 1-60 MIN, CERVICAL SPINE - 1 VIEW  Technique: The C-arm was provided for intraoperative fluoroscopy.  Comparison:  Cervical myelogram dated 10/21/2011  Findings: Anterior cervical fusion device has been placed at the C3- 4 level with screws extending into both C3 and C4 vertebral bodies. Alignment appears normal.  IMPRESSION: Imaging obtained during anterior fusion at the C3-4 level   Original Report Authenticated By: Reola Calkins, M.D.    Dg C-arm 1-60 Min  10/25/2011  *RADIOLOGY REPORT*  Clinical  Data: Anterior cervical fusion.  C-ARM 1-60 MIN, CERVICAL SPINE - 1 VIEW  Technique: The C-arm was provided for intraoperative fluoroscopy.  Comparison:  Cervical myelogram dated 10/21/2011  Findings: Anterior cervical fusion device has been placed at the C3- 4 level with screws extending into both C3 and C4 vertebral bodies. Alignment appears normal.  IMPRESSION: Imaging obtained during anterior fusion at the C3-4 level   Original Report Authenticated By: Reola Calkins, M.D.     Assessment/Plan: Doing well.  D/C home.    LOS: 1 day    Dorian Heckle, MD 10/25/2011, 11:25 AM

## 2011-10-25 NOTE — Op Note (Signed)
NAMETODDY, BOYD NO.:  [id number]  MEDICAL RECORD NO.:  [id number]  LOCATION:  [id number]                        FACILITY:  [facility]  PHYSICIAN:  Hilda Lias, M.D.   DATE OF BIRTH:  [birth year]  DATE OF PROCEDURE:  10/24/2011 DATE OF DISCHARGE:                              OPERATIVE REPORT   PREOPERATIVE DIAGNOSIS:  C3-C4 spondylolisthesis with acute left C4 radiculopathy, status post fusion at C4-5, C5-6, and C6-7.  POSTOPERATIVE DIAGNOSIS:  C3-C4 spondylolisthesis with acute left C4 radiculopathy, status post fusion at C4-5, C5-6, and C6-7.  PROCEDURE:  Anterior C3-4 diskectomy, decompression of spinal cord, bilateral foraminotomy, interbody fusion with plate.  Autograft, morselized bone graft, microscope.  SURGEON:  Hilda Lias, MD  ASSISTANT:  Hewitt Shorts, MD  CLINICAL HISTORY:  Mr. Lipinski is a gentleman who for the past 3-4 weeks had been complaining of neck pain radiation to the left shoulder right to trapezius muscles.  This problem came acutely and there is no weakness or any sensory changes.  The patient had two rounds of cortisone with improvement, but the pain came back immediately soon after he stopped.  We did a myelogram, which showed essentially negative result, but there was a step-off, spondylolisthesis between C3-4.  We tried more conservative treatment, but he called me last night and told me that he was getting worse.  He and his wife knew that although clinically, I was quite suspicious of C4 radiculopathy, the only thing I was able to prove that he has step-off between C3 and C4.  Because of that, we agree with surgery knowing that the outcome was noted to be 100% accurate in relation about the soft finding in the x-ray. Nevertheless, I realized that there was nothing else to be taking care. His shoulder was negative.  The rest of the spine showed mild spondylosis.  The risks were fully explained to them including  the possibility of no improvement, need of further surgery, paralysis, dimension to vocal cord.  PROCEDURE:  The patient was taken to the OR, and after intubation, the traction with 10-pound was made to the neck.  The left side of the neck was cleaned with DuraPrep and drapes were applied.  Incision following the previous one through the skin and subcutaneous tissue was carried down.  The platysma was opened, and we did a retraction laterally.  The patient had quite a bit of adhesion from the previous surgery. Immediately, we found the plate and immediately we knew that right up over C3-C4.  We opened the posterior ligament and with the help of the microscope, we did a diskectomy.  The patient had quite a bit of spondylosis and also this was taken care with the drill.  Then, the posterior ligament was opened.  We went straight to the right side and we drilled the right side.  In the left side, we found that there was little more soft tissue, although I was then able to prove there was any actual herniated disk.  Nevertheless, that area was quite more narrow than the right one.  Having done this, the area was irrigated.  Then, I placed a plate with screws attached to it using  autograft and morselized bone graft was used for fusion.  This procedure was done under the C- arm.  Once we find out that we were in good location, the two screws; one to the body of C3 and other to the body of C4 were imbalanced until they were completely lock.  From then on, the area was irrigated.  We waited 10 minutes to just to be sure that we had good hemostasis.  The area was irrigated.  We decided there was no need for any drain.  From then on, the wound was closed with Vicryl and Steri-Strips.  The patient went back to the recovery room.  I just waited half an hour just to be sure that he was on awake and immediately, he realized that the pain that he was having prior to surgery was completely  gone.          ______________________________ Hilda Lias, M.D.     EB/MEDQ  D:  10/24/2011  T:  10/25/2011  Job:  [id number]

## 2011-10-26 NOTE — Care Management Note (Signed)
    Page 1 of 1   10/26/2011     3:54:09 PM   CARE MANAGEMENT NOTE 10/26/2011  Patient:  KYLLE, LALL   Account Number:  [id number]  Date Initiated:  10/26/2011  Documentation initiated by:  Wilton Thrall  Subjective/Objective Assessment:   Pt admitted s/p cervical fusion     Action/Plan:   No CM/discharge planning needs identified   Anticipated DC Date:  10/25/2011   Anticipated DC Plan:  HOME/SELF CARE      DC Planning Services  CM consult      Choice offered to / List presented to:             Status of service:  Completed, signed off Medicare Important Message given?   (If response is "NO", the following Medicare IM given date fields will be blank) Date Medicare IM given:   Date Additional Medicare IM given:    Discharge Disposition:  HOME/SELF CARE  Per UR Regulation:  Reviewed for med. necessity/level of care/duration of stay  If discussed at Long Length of Stay Meetings, dates discussed:    Comments:  10/26/11 15:50 CM referral received for home health.  Pt discharged 10/25/11, no PT or OT notes in system, call to 4N charge nurse [facility]) she was unaware of any patient needs. Call to patient who stated he was doing fine and did not require Home care. Jim Like RN CCM [facility].

## 2011-10-30 ENCOUNTER — Encounter (HOSPITAL_COMMUNITY): Payer: Self-pay | Admitting: Neurosurgery

## 2012-01-19 ENCOUNTER — Other Ambulatory Visit: Payer: Self-pay

## 2012-01-19 MED ORDER — ATORVASTATIN CALCIUM 80 MG PO TABS: 80.0000 mg | ORAL_TABLET | Freq: Every day | ORAL | Status: DC

## 2012-01-23 ENCOUNTER — Ambulatory Visit (INDEPENDENT_AMBULATORY_CARE_PROVIDER_SITE_OTHER): Payer: 59 | Admitting: Cardiology

## 2012-01-23 ENCOUNTER — Encounter: Payer: Self-pay | Admitting: Cardiology

## 2012-01-23 VITALS — BP 132/78 | HR 74 | Resp 18 | Ht 68.0 in | Wt 193.0 lb

## 2012-01-23 DIAGNOSIS — I10 Essential (primary) hypertension: Secondary | ICD-10-CM

## 2012-01-23 DIAGNOSIS — I2510 Atherosclerotic heart disease of native coronary artery without angina pectoris: Secondary | ICD-10-CM

## 2012-01-23 DIAGNOSIS — E119 Type 2 diabetes mellitus without complications: Secondary | ICD-10-CM

## 2012-01-23 DIAGNOSIS — E7800 Pure hypercholesterolemia, unspecified: Secondary | ICD-10-CM

## 2012-01-23 NOTE — Patient Instructions (Signed)
Continue your current therapy  I will see you in one year   

## 2012-01-23 NOTE — Progress Notes (Signed)
Kevin Wheeler Date of Birth: [birth year]   History of Present Illness: Kevin Wheeler is seen today for yearly followup. He is status post CABG in 1999. His last stress test in the fall of 2011 was normal. He states he has done very well over the past year. He denies any symptoms of chest pain or shortness of breath. He did undergo C3-4 surgery in August of this year. This has significantly limited his activity. He is still in rehabilitation for this.  Current Outpatient Prescriptions on File Prior to Visit  Medication Sig Dispense Refill  . aspirin 81 MG tablet Take 81 mg by mouth daily.        Marland Kitchen atorvastatin (LIPITOR) 80 MG tablet Take 1 tablet (80 mg total) by mouth daily.  15 tablet  0  . trandolapril (MAVIK) 2 MG tablet TAKE 1 TABLET DAILY  90 tablet  0  . [DISCONTINUED] atorvastatin (LIPITOR) 80 MG tablet TAKE 1 TABLET DAILY  90 tablet  0  . cyclobenzaprine (FLEXERIL) 5 MG tablet Take 5 mg by mouth 3 (three) times daily as needed. Muscle spasm      . HYDROcodone-acetaminophen (VICODIN) 5-500 MG per tablet Take 1 tablet by mouth every 4 (four) hours as needed. pain      . [DISCONTINUED] trandolapril (MAVIK) 2 MG tablet Take 2 mg by mouth daily.        No Known Allergies  Past Medical History  Diagnosis Date  . Coronary artery disease   . Hypertension   . Hypercholesteremia   . Hiatal hernia   . Diabetes mellitus type 2, noninsulin dependent     Diet controlled.  . Constipation   . Cancer     Skin Cancers- Basal cell 3 arm 1 forehead  . Arthritis     Past Surgical History  Procedure Date  . Colonoscopy   . Appendectomy   . Vasectomy   . Cardiac catheterization     Ejection fraction is estimated at 60%  . Coronary artery bypass graft February 15, 1998    Included an LIMA graft to the LAD, saphenous vein graft sequentially to the intermediate and obtuse marginal vessels, and a sequential vein graft to the acute marginal and distal circumflex. There is also a vein graft to the  diagonal  . Anterior cervical decomp/discectomy fusion 10/24/2011    Procedure: ANTERIOR CERVICAL DECOMPRESSION/DISCECTOMY FUSION 1 LEVEL;  Surgeon: Karn Cassis, MD;  Location: [facility];  Service: Neurosurgery;  Laterality: N/A;  Cervical three four  Anterior cervical decompression/diskectomy/fusion/Plate    History  Smoking status  . Never Smoker   Smokeless tobacco  . Not on file    History  Alcohol Use  . 4.8 oz/week  . 8 Glasses of wine per week    Family History  Problem Relation Age of Onset  . Heart disease Father     Review of Systems: As noted in history of present illness.  All other systems were reviewed and are negative.  Physical Exam: BP 132/78  Pulse 74  Resp 18  Ht 5\' 8"  (1.727 m)  Wt 193 lb (87.544 kg)  BMI 29.35 kg/m2  SpO2 98% The patient is alert and oriented x 3.  The mood and affect are normal.  The skin is warm and dry.   The HEENT exam reveals that the sclera are nonicteric.  The mucous membranes are moist.  The carotids are 2+ without bruits.  There is no thyromegaly. His anterior cervical scar has healed well.  There is no JVD.  The lungs are clear.   The heart exam reveals a regular rate with a normal S1 and S2.  There are no murmurs, gallops, or rubs.  The PMI is not displaced.   Abdominal exam reveals good bowel sounds.      Exam of the legs reveal no clubbing, cyanosis, or edema.  The legs are without rashes.  The distal pulses are intact.  Cranial nerves II - XII are intact.  Motor and sensory functions are intact.  The gait is normal. LABORATORY DATA: ECG today is normal.  Assessment / Plan: 1. Coronary disease status post CABG in 1999. He remains asymptomatic. Recommend continued aspirin and statin therapy. I'll followup again in one year.  2. Hypertension under good control on Mavik.  3. Hyperlipidemia, well controlled. We reviewed his lab work from January 2013. He also had lab work in July which he reports even look better.

## 2012-02-02 ENCOUNTER — Other Ambulatory Visit: Payer: Self-pay

## 2012-02-02 MED ORDER — TRANDOLAPRIL 2 MG PO TABS: 2.0000 mg | ORAL_TABLET | Freq: Every day | ORAL | Status: DC

## 2012-02-04 ENCOUNTER — Telehealth: Payer: Self-pay | Admitting: Cardiology

## 2012-02-04 NOTE — Telephone Encounter (Signed)
New PRoblem:    Called in from [facility] needing clearance for atorvastatin (LIPITOR) 80 MG tablet and trandolapril (MAVIK) 2 MG tablet. Please call back.

## 2012-02-05 ENCOUNTER — Telehealth: Payer: Self-pay

## 2012-02-05 NOTE — Telephone Encounter (Signed)
Called and spoke to [place] at [facility] about refills for [facility] 2 MG and Atorvastatin 80 MG. Refilled them verbally over the phone with Revonda Standard at [facility] for the pt with 90 tabs and 2 refills.

## 2012-02-20 NOTE — Addendum Note (Signed)
Addended by: Lacie Scotts on: 02/20/2012 03:26 PM   Modules accepted: Orders

## 2012-12-09 ENCOUNTER — Other Ambulatory Visit: Payer: Self-pay | Admitting: Cardiology

## 2013-01-26 ENCOUNTER — Encounter: Payer: Self-pay | Admitting: Cardiology

## 2013-01-26 ENCOUNTER — Ambulatory Visit (INDEPENDENT_AMBULATORY_CARE_PROVIDER_SITE_OTHER): Payer: 59 | Admitting: Cardiology

## 2013-01-26 VITALS — BP 142/90 | HR 64 | Ht 68.0 in | Wt 188.4 lb

## 2013-01-26 DIAGNOSIS — E7800 Pure hypercholesterolemia, unspecified: Secondary | ICD-10-CM

## 2013-01-26 DIAGNOSIS — I10 Essential (primary) hypertension: Secondary | ICD-10-CM

## 2013-01-26 DIAGNOSIS — E119 Type 2 diabetes mellitus without complications: Secondary | ICD-10-CM

## 2013-01-26 DIAGNOSIS — I2510 Atherosclerotic heart disease of native coronary artery without angina pectoris: Secondary | ICD-10-CM

## 2013-01-26 MED ORDER — TRANDOLAPRIL 2 MG PO TABS: 2.0000 mg | ORAL_TABLET | Freq: Every day | ORAL | Status: DC

## 2013-01-26 NOTE — Patient Instructions (Signed)
Continue your current therapy  I will see you in 12 months.   

## 2013-01-26 NOTE — Progress Notes (Signed)
Kevin Wheeler Date of Birth: [birth year]   History of Present Illness: Kevin Wheeler is seen today for yearly followup. He is status post CABG in 1999. His last stress test in the fall of 2011 was normal. He states he has done very well over the past year. He denies any symptoms of chest pain or shortness of breath. He reports he has been active and walks 3 miles per day.  Current Outpatient Prescriptions on File Prior to Visit  Medication Sig Dispense Refill  . aspirin 81 MG tablet Take 81 mg by mouth daily.        Marland Kitchen atorvastatin (LIPITOR) 80 MG tablet Take 1 tablet by mouth  daily  90 tablet  0   No current facility-administered medications on file prior to visit.    No Known Allergies  Past Medical History  Diagnosis Date  . Coronary artery disease   . Hypertension   . Hypercholesteremia   . Hiatal hernia   . Diabetes mellitus type 2, noninsulin dependent     Diet controlled.  . Constipation   . Cancer     Skin Cancers- Basal cell 3 arm 1 forehead  . Arthritis     Past Surgical History  Procedure Laterality Date  . Colonoscopy    . Appendectomy    . Vasectomy    . Cardiac catheterization      Ejection fraction is estimated at 60%  . Coronary artery bypass graft  February 15, 1998    Included an LIMA graft to the LAD, saphenous vein graft sequentially to the intermediate and obtuse marginal vessels, and a sequential vein graft to the acute marginal and distal circumflex. There is also a vein graft to the diagonal  . Anterior cervical decomp/discectomy fusion  10/24/2011    Procedure: ANTERIOR CERVICAL DECOMPRESSION/DISCECTOMY FUSION 1 LEVEL;  Surgeon: Karn Cassis, MD;  Location: [facility];  Service: Neurosurgery;  Laterality: N/A;  Cervical three four  Anterior cervical decompression/diskectomy/fusion/Plate    History  Smoking status  . Never Smoker   Smokeless tobacco  . Not on file    History  Alcohol Use  . 4.8 oz/week  . 8 Glasses of wine per week     Family History  Problem Relation Age of Onset  . Heart disease Father     Review of Systems: As noted in history of present illness.  All other systems were reviewed and are negative.  Physical Exam: BP 142/90  Pulse 64  Ht 5\' 8"  (1.727 m)  Wt 188 lb 6.4 oz (85.458 kg)  BMI 28.65 kg/m2 He is a pleasant white male in no acute distress.  The HEENT exam is normal. The carotids are 2+ without bruits.  There is no thyromegaly.There is no JVD.  The lungs are clear.   The heart exam reveals a regular rate with a normal S1 and S2.  There are no murmurs, gallops, or rubs.  The PMI is not displaced.   Abdominal exam reveals good bowel sounds.      Exam of the legs reveal no clubbing, cyanosis, or edema.  The legs are without rashes.  The distal pulses are intact.  Cranial nerves II - XII are intact.  Motor and sensory functions are intact.  The gait is normal. LABORATORY DATA: ECG today is normal.  Laboratory data from July 2014 showed an A1c of 6.3%. Cholesterol 135, HDL 41, LDL 77.  Assessment / Plan: 1. Coronary disease status post CABG in 1999. He remains  asymptomatic. Recommend continued aspirin and statin therapy. I'll followup again in one year. We discussed updating his stress test but he would like to defer this for another year.  2. Hypertension under good control on Mavik.  3. Hyperlipidemia, well controlled.

## 2013-03-17 ENCOUNTER — Other Ambulatory Visit: Payer: Self-pay | Admitting: Cardiology

## 2013-08-04 ENCOUNTER — Other Ambulatory Visit: Payer: Self-pay | Admitting: Family Medicine

## 2013-08-04 ENCOUNTER — Ambulatory Visit
Admission: RE | Admit: 2013-08-04 | Discharge: 2013-08-04 | Disposition: A | Discharge status: Home / Self Care | Payer: 59 | Source: Ambulatory Visit | Attending: Family Medicine | Admitting: Family Medicine

## 2013-08-04 DIAGNOSIS — M25472 Effusion, left ankle: Secondary | ICD-10-CM

## 2013-08-19 ENCOUNTER — Encounter: Payer: Self-pay | Admitting: Cardiology

## 2013-11-02 ENCOUNTER — Ambulatory Visit (INDEPENDENT_AMBULATORY_CARE_PROVIDER_SITE_OTHER): Payer: 59 | Admitting: Podiatrist

## 2013-11-02 ENCOUNTER — Ambulatory Visit (INDEPENDENT_AMBULATORY_CARE_PROVIDER_SITE_OTHER): Payer: 59

## 2013-11-02 ENCOUNTER — Encounter: Payer: Self-pay | Admitting: Podiatrist

## 2013-11-02 DIAGNOSIS — M722 Plantar fascial fibromatosis: Secondary | ICD-10-CM

## 2013-11-02 DIAGNOSIS — M7740 Metatarsalgia, unspecified foot: Secondary | ICD-10-CM

## 2013-11-02 DIAGNOSIS — B351 Tinea unguium: Secondary | ICD-10-CM

## 2013-11-02 DIAGNOSIS — M7750 Other enthesopathy of unspecified foot: Secondary | ICD-10-CM

## 2013-11-02 MED ORDER — TRIAMCINOLONE ACETONIDE 10 MG/ML IJ SUSP: 10.0000 mg | Freq: Once | INTRAMUSCULAR | Status: DC

## 2013-11-02 NOTE — Progress Notes (Signed)
   Subjective:    Patient ID: Kevin Wheeler, male    DOB: 1950/11/12, 63 y.o.   MRN: [id number]  HPI Rt foot heel and ball of the foot is been painful 1 month. The foot is getting worse, especially first step in the morning. The foot get aggravated by walking/pressure. Tried to put pad on the ball of the and it help.   Review of Systems  Musculoskeletal: Positive for gait problem.  All other systems reviewed and are negative.      Objective:   Physical Exam Patient is awake, alert, and oriented x 3.  In no acute distress.  Vascular status is intact with palpable pedal pulses at 2/4 DP and PT bilateral and capillary refill time within normal limits. Neurological sensation is also intact bilaterally via Semmes Weinstein monofilament at 5/5 sites. Light touch, vibratory sensation, Achilles tendon reflex is intact. Dermatological exam reveals skin color, turger and texture as normal. No open lesions present.  Musculature intact with dorsiflexion, plantarflexion, inversion, eversion. Prominent plantarflexed metatarsals with metatarsalgia-type symptomatology is noted. Pain on palpation plantar medial aspect of the right foot is also present.    Assessment & Plan:  Under fasciitis, metatarsalgia right  Plan: Injected the right heel with Kenalog Marcaine mixture. A low dye strapping was applied to simulate an orthotic. Discussed a custom orthotic with a metatarsal bar in the future if the low dye strapping is comfortable makes a difference. We'll also consider a metatarsal bar from get his shoe repair if necessary.

## 2013-11-02 NOTE — Patient Instructions (Signed)

## 2013-11-10 ENCOUNTER — Telehealth: Payer: Self-pay | Admitting: *Deleted

## 2013-11-10 NOTE — Telephone Encounter (Signed)
I was there last Wednesday, August 9th.  The question I have is a follow-up question about what do I do now.  I got a shot and got my foot taped up.  I'm not sure what the next step is.  If you could please give me a call.    I called and informed him that the next step is orthotics if you feel that the strapping helped.  He stated that it did.  I told him he would need to schedule a follow up appointment with Dr. Valentina Lucks.  I asked if he would like for me to send him to a scheduler.  He stated he was in the middle of something and would call back.  He asked when he is supposed to take the taping off.  I told him if it has been on for 5 days, go ahead and take it off.

## 2013-11-11 ENCOUNTER — Ambulatory Visit: Payer: 59

## 2013-11-11 DIAGNOSIS — M722 Plantar fascial fibromatosis: Secondary | ICD-10-CM

## 2013-11-11 NOTE — Progress Notes (Signed)
Pt is here to be scanned for orthotics

## 2013-12-02 ENCOUNTER — Encounter: Payer: Self-pay | Admitting: Podiatrist

## 2013-12-02 ENCOUNTER — Ambulatory Visit (INDEPENDENT_AMBULATORY_CARE_PROVIDER_SITE_OTHER): Payer: 59 | Admitting: Podiatrist

## 2013-12-02 DIAGNOSIS — M7740 Metatarsalgia, unspecified foot: Secondary | ICD-10-CM

## 2013-12-02 DIAGNOSIS — M722 Plantar fascial fibromatosis: Secondary | ICD-10-CM

## 2013-12-02 NOTE — Patient Instructions (Signed)

## 2013-12-02 NOTE — Progress Notes (Signed)
Subjective: Patient presents today to pick up the orthotics. A metatarsal pad is part of the orthotic and he will have to adjust to it. If the metatarsal pad previous to be uncomfortable he will call and let us know after 2 weeks of wear and we can remove it or make it smaller.

## 2013-12-08 ENCOUNTER — Telehealth: Payer: Self-pay | Admitting: *Deleted

## 2013-12-08 NOTE — Telephone Encounter (Signed)
On Friday October 9th I picked up orthotics that I had just ordered.  Today I was walking, normal walk and something popped in my heal.  It was very painful and I was wondering what I needed to do.  I called and left a message apologizing for not calling him back yesterday. I was out of the office.  I informed him that I would have suggested you schedule an appointment with Dr. Valentina Lucks for follow-up.  I see you have already scheduled an appointment so we'll see you then.

## 2013-12-09 ENCOUNTER — Encounter: Payer: Self-pay | Admitting: Podiatrist

## 2013-12-09 ENCOUNTER — Ambulatory Visit (INDEPENDENT_AMBULATORY_CARE_PROVIDER_SITE_OTHER): Payer: 59 | Admitting: Podiatrist

## 2013-12-09 VITALS — BP 140/78 | HR 76 | Resp 14

## 2013-12-09 DIAGNOSIS — M62179 Other rupture of muscle (nontraumatic), unspecified ankle and foot: Secondary | ICD-10-CM

## 2013-12-09 DIAGNOSIS — M629 Disorder of muscle, unspecified: Secondary | ICD-10-CM

## 2013-12-09 NOTE — Patient Instructions (Addendum)
Wear your boot as much as you can when you are up and on your foot.  You don't have to wear it at night or when resting.  Keep icing as you have been doing and wearing the arch pads is fine also.

## 2013-12-09 NOTE — Progress Notes (Signed)
  Chief Complaint  Patient presents with  . Foot Injury    "I walk for a hour or two, but Wednesday I was walking and I felt a pop and I couldn't go any further."  right lateral plantar heel     HPI: Patient is 63 y.o. male who presents today for pain in the right heel. He states his heel was feeling better but he was walking for exercise and felt and heard a pop.  He denies swelling or bruising.  Relates he has quite a bit of pain.    Physical Exam  Patient is awake, alert, and oriented x 3.  In no acute distress.  Vascular status is intact with palpable pedal pulses at 2/4 DP and PT bilateral and capillary refill time within normal limits. Neurological sensation is also intact bilaterally via Semmes Weinstein monofilament at 5/5 sites. Light touch, vibratory sensation, Achilles tendon reflex is intact. Dermatological exam reveals skin color, turger and texture as normal. No open lesions present.  Musculature intact with dorsiflexion, plantarflexion, inversion, eversion.  Pain on plantar right foot is noted.  No palpable defect is appreciated.  Probably plantar fascial tear at the insertion favored.   Assessment: plantar fascial tear right  Plan:  A taping was applied.  Put him in a short air fracture walker as well.  Will recheck the foot in 4 weeks.

## 2013-12-21 ENCOUNTER — Ambulatory Visit: Payer: 59 | Admitting: Podiatrist

## 2013-12-27 DIAGNOSIS — IMO0002 Reserved for concepts with insufficient information to code with codable children: Secondary | ICD-10-CM | POA: Insufficient documentation

## 2013-12-27 DIAGNOSIS — H3554 Dystrophies primarily involving the retinal pigment epithelium: Secondary | ICD-10-CM | POA: Insufficient documentation

## 2013-12-30 ENCOUNTER — Ambulatory Visit: Payer: 59 | Admitting: Podiatrist

## 2014-01-04 ENCOUNTER — Encounter: Payer: Self-pay | Admitting: Podiatrist

## 2014-01-04 ENCOUNTER — Ambulatory Visit (INDEPENDENT_AMBULATORY_CARE_PROVIDER_SITE_OTHER): Payer: 59 | Admitting: Podiatrist

## 2014-01-04 VITALS — BP 133/65 | HR 83 | Resp 16

## 2014-01-04 DIAGNOSIS — M629 Disorder of muscle, unspecified: Secondary | ICD-10-CM

## 2014-01-04 DIAGNOSIS — M62179 Other rupture of muscle (nontraumatic), unspecified ankle and foot: Secondary | ICD-10-CM

## 2014-01-04 NOTE — Patient Instructions (Signed)
Heat Therapy °Heat therapy can help make painful, stiff muscles and joints feel better. Do not use heat on new injuries. Wait at least 48 hours after an injury to use heat. Do not use heat when you have aches or pains right after an activity. If you still have pain 3 hours after stopping the activity, then you may use heat. °HOME CARE °Wet heat pack °· Soak a clean towel in warm water. Squeeze out the extra water. °· Put the warm, wet towel in a plastic bag. °· Place a thin, dry towel between your skin and the bag. °· Put the heat pack on the area for 5 minutes, and check your skin. Your skin may be pink, but it should not be red. °· Leave the heat pack on the area for 15 to 30 minutes. °· Repeat this every 2 to 4 hours while awake. Do not use heat while you are sleeping. °Warm water bath °· Fill a tub with warm water. °· Place the affected body part in the tub. °· Soak the area for 20 to 40 minutes. °· Repeat as needed. °Hot water bottle °· Fill the water bottle half full with hot water. °· Press out the extra air. Close the cap tightly. °· Place a dry towel between your skin and the bottle. °· Put the bottle on the area for 5 minutes, and check your skin. Your skin may be pink, but it should not be red. °· Leave the bottle on the area for 15 to 30 minutes. °· Repeat this every 2 to 4 hours while awake. °Electric heating pad °· Place a dry towel between your skin and the heating pad. °· Set the heating pad on low heat. °· Put the heating pad on the area for 10 minutes, and check your skin. Your skin may be pink, but it should not be red. °· Leave the heating pad on the area for 20 to 40 minutes. °· Repeat this every 2 to 4 hours while awake. °· Do not lie on the heating pad. °· Do not fall asleep while using the heating pad. °· Do not use the heating pad near water. °GET HELP RIGHT AWAY IF: °· You get blisters or red skin. °· Your skin is puffy (swollen), or you lose feeling (numbness) in the affected area. °· You  have any new problems. °· Your problems are getting worse. °· You have any questions or concerns. °If you have any problems, stop using heat therapy until you see your doctor. °MAKE SURE YOU: °· Understand these instructions. °· Will watch your condition. °· Will get help right away if you are not doing well or get worse. °Document Released: 05/05/2011 Document Reviewed: 04/05/2013 °[employer]® Patient Information ©2015 [employer]. This information is not intended to replace advice given to you by your health care provider. Make sure you discuss any questions you have with your health care provider. ° °

## 2014-01-08 NOTE — Progress Notes (Signed)
   HPI: Patient is 63 y.o. male who presents today for follow up of plantar fascial tear on the right where he has been wearing the boot.  He states the pain in improved since wearing the boot however he is still not 100%.  He denies any calf pain or tenderness.    Physical Exam  Neurovascular status continues to be intact.  No pain in the calf musculature with palpation.  contined but improved discomfort at the midsubstance of the plantar fascia is noted on the right foot.  No palpable defect is appreciated.  Probably plantar fascial tear at the insertion favored.   Assessment: plantar fascial tear right  Plan:  Air fracture walker will be continued.  i will see him in 4 weeks for follow up to see if the foot is improved at that time.  He will call if any questions or concerns arise.

## 2014-02-03 ENCOUNTER — Ambulatory Visit (INDEPENDENT_AMBULATORY_CARE_PROVIDER_SITE_OTHER): Payer: 59 | Admitting: Podiatrist

## 2014-02-03 ENCOUNTER — Encounter: Payer: Self-pay | Admitting: Podiatrist

## 2014-02-03 VITALS — BP 109/86 | HR 81 | Resp 16

## 2014-02-03 DIAGNOSIS — M722 Plantar fascial fibromatosis: Secondary | ICD-10-CM

## 2014-02-03 DIAGNOSIS — M62179 Other rupture of muscle (nontraumatic), unspecified ankle and foot: Secondary | ICD-10-CM

## 2014-02-03 DIAGNOSIS — M629 Disorder of muscle, unspecified: Secondary | ICD-10-CM

## 2014-02-03 NOTE — Patient Instructions (Signed)
You may wear your orthotics for as long as they are comfortable.  If they become uncomfortable start to wear them for an hour each day and increase by an hour each day until you are able to wear them without pain.  If they are painful for more than 7 days, please let me know .  There are adjustments that can be made.

## 2014-02-03 NOTE — Progress Notes (Signed)
   HPI: Patient is 63 y.o. male who presents today for follow up of plantar fascial tear on the right where he has been wearing the boot.  He states the pain in improved since wearing the boot however he is still not 100%.  He denies any calf pain or tenderness.  His orthotics are ready for him to pick up as well.  Physical Exam  Neurovascular status continues to be intact.  No pain in the calf musculature with palpation.  contined but improved discomfort at the midsubstance of the plantar fascia is noted on the right foot.  No palpable defect is appreciated.  Probably plantar fascial tear at the insertion favored.   Assessment: plantar fascial tear right  Plan:  Air fracture walker will be continued for 2 more weeks and then he may wean out of this area fracture walker back into a shoe with a good orthotic insert.Marland Kitchen  i will see him in 4 weeks for follow up to see if the foot is improved at that time.  He will call if any questions or concerns arise.

## 2014-02-27 ENCOUNTER — Encounter: Payer: Self-pay | Admitting: Cardiology

## 2014-02-27 ENCOUNTER — Ambulatory Visit (INDEPENDENT_AMBULATORY_CARE_PROVIDER_SITE_OTHER): Payer: 59 | Admitting: Cardiology

## 2014-02-27 VITALS — BP 140/72 | HR 72 | Ht 69.0 in | Wt 192.7 lb

## 2014-02-27 DIAGNOSIS — I25810 Atherosclerosis of coronary artery bypass graft(s) without angina pectoris: Secondary | ICD-10-CM

## 2014-02-27 DIAGNOSIS — E7800 Pure hypercholesterolemia, unspecified: Secondary | ICD-10-CM

## 2014-02-27 DIAGNOSIS — I10 Essential (primary) hypertension: Secondary | ICD-10-CM

## 2014-02-27 NOTE — Patient Instructions (Signed)
Continue your current therapy  I will see you in one year  Send me a copy of your lab work with Dr. Alroy Dust.

## 2014-02-27 NOTE — Progress Notes (Signed)
Kevin Wheeler Date of Birth: 1950/05/28   History of Present Illness: Kevin Wheeler is seen today for follow up of CAD. He is status post CABG in 1999. His last stress Myoview test in the fall of 2011 was normal. He is doing very well from a cardiac standpoint.  He denies any symptoms of chest pain or shortness of breath. He did develop a torn plantar tendon. He has had injections and wore a boot for 11 weeks. He has been inactive and gained 10 lbs. Prior to this he was walking 4 days/week.   Current Outpatient Prescriptions on File Prior to Visit  Medication Sig Dispense Refill  . aspirin 81 MG tablet Take 81 mg by mouth daily.      Marland Kitchen atorvastatin (LIPITOR) 80 MG tablet Take 1 tablet by mouth  daily 90 tablet 3  . trandolapril (MAVIK) 2 MG tablet Take 1 tablet by mouth  daily 90 tablet 3   Current Facility-Administered Medications on File Prior to Visit  Medication Dose Route Frequency Provider Last Rate Last Dose  . triamcinolone acetonide (KENALOG) 10 MG/ML injection 10 mg  10 mg Other Once Bronson Ing, DPM        No Known Allergies  Past Medical History  Diagnosis Date  . Coronary artery disease   . Hypertension   . Hypercholesteremia   . Hiatal hernia   . Diabetes mellitus type 2, noninsulin dependent     Diet controlled.  . Constipation   . Cancer     Skin Cancers- Basal cell 3 arm 1 forehead  . Arthritis   . Macular hole   . Plantar fascia rupture     Past Surgical History  Procedure Laterality Date  . Colonoscopy    . Appendectomy    . Vasectomy    . Cardiac catheterization      Ejection fraction is estimated at 60%  . Coronary artery bypass graft  February 15, 1998    Included an LIMA graft to the LAD, saphenous vein graft sequentially to the intermediate and obtuse marginal vessels, and a sequential vein graft to the acute marginal and distal circumflex. There is also a vein graft to the diagonal  . Anterior cervical decomp/discectomy fusion  10/24/2011   Procedure: ANTERIOR CERVICAL DECOMPRESSION/DISCECTOMY FUSION 1 LEVEL;  Surgeon: Floyce Stakes, MD;  Location: [place] [facility];  Service: Neurosurgery;  Laterality: N/A;  Cervical three four  Anterior cervical decompression/diskectomy/fusion/Plate    History  Smoking status  . Never Smoker   Smokeless tobacco  . Not on file    History  Alcohol Use  . 4.8 oz/week  . 8 Glasses of wine per week    Family History  Problem Relation Age of Onset  . Heart disease Father     Review of Systems: As noted in history of present illness.  He has had several skin cancers removed. He also has been diagnosed with a macular hole. All other systems were reviewed and are negative.  Physical Exam: BP 140/72 mmHg  Pulse 72  Ht 5\' 9"  (1.753 m)  Wt 192 lb 11.2 oz (87.408 kg)  BMI 28.44 kg/m2 He is a pleasant white male in no acute distress.  The HEENT exam is normal. The carotids are 2+ without bruits.  There is no thyromegaly.There is no JVD.  The lungs are clear.   The heart exam reveals a regular rate with a normal S1 and S2.  There are no murmurs, gallops, or rubs.  The  PMI is not displaced.   Abdominal exam reveals good bowel sounds.      Exam of the legs reveal no clubbing, cyanosis, or edema.  The distal pulses are intact.  Cranial nerves II - XII are intact.  Motor and sensory functions are intact.  The gait is normal.  LABORATORY DATA: ECG today is normal. Rate 72. I have personally reviewed and interpreted this study.   Assessment / Plan: 1. Coronary disease status post CABG in 1999. He remains asymptomatic. Last Myoview study in 2011.  Recommend continued aspirin and statin therapy. I'll followup again in one year. We discussed updating his stress test but given his recent foot issues and inactivity we will wait until next year.   2. Hypertension under good control on Mavik.  3. Hyperlipidemia-labs followed by Dr. Alroy Dust. Will request a copy.

## 2014-03-02 ENCOUNTER — Telehealth: Payer: Self-pay | Admitting: Cardiology

## 2014-03-02 NOTE — Telephone Encounter (Signed)
Returned call to patient he stated he was reviewing 02/27/14 after visit summary and wanted to know why triamcinolone acetonide ( kenalog ) 10 mg injection on medication list.Advised he received that injection at 11/02/13 office visit with Dr.Egerton.

## 2014-03-02 NOTE — Telephone Encounter (Signed)
Pt called back and wants you to call him on his cell phone.

## 2014-03-02 NOTE — Telephone Encounter (Signed)
Pt saw Dr Martinique on 02-27-14,he has a question about his papers he received at the end of office visit.

## 2014-03-03 ENCOUNTER — Encounter: Payer: Self-pay | Admitting: Podiatrist

## 2014-03-03 ENCOUNTER — Ambulatory Visit (INDEPENDENT_AMBULATORY_CARE_PROVIDER_SITE_OTHER): Payer: 59 | Admitting: Podiatrist

## 2014-03-03 VITALS — BP 144/81 | HR 70 | Resp 16

## 2014-03-03 DIAGNOSIS — M62179 Other rupture of muscle (nontraumatic), unspecified ankle and foot: Secondary | ICD-10-CM

## 2014-03-03 DIAGNOSIS — M629 Disorder of muscle, unspecified: Secondary | ICD-10-CM

## 2014-03-03 NOTE — Progress Notes (Signed)
   HPI: Patient is 64 y.o. male who presents today for follow up of plantar fascial tear on the right foot. He states the pain has improved however he is still having pain in the foot. He denies any new trauma or injury to the foot.   Physical Exam  Neurovascular status continues to be intact.  No pain in the calf musculature with palpation.  contined but improved discomfort at the midsubstance of the plantar fascia is noted on the right foot.  No palpable defect is appreciated.  Probably plantar fascial tear at the insertion favored.   Assessment: plantar fascial tear right  Plan: Because of the length of the time that he's had this plantar fascial tear I recommended an MRI to determine the healing of the tear. With this information I'll better be able to give him a timeframe for when he will be improved. We may also need to consider cast immobilization if the tear is still significant enough. An MRI will be set up on his behalf for the right foot. Also discussed taping the foot with Kinesio tape and gave instructions for trying this out to see if this will help

## 2014-03-03 NOTE — Patient Instructions (Signed)
   This is how to use a Kinesio tape-- if you google "kinesio tape for plantar fascia"  It will show you a video and images on how to use the tape-- the tape is found at most drug stores.

## 2014-03-14 ENCOUNTER — Telehealth: Payer: Self-pay | Admitting: *Deleted

## 2014-03-14 NOTE — Telephone Encounter (Signed)
Pt is scheduled for MRI of foot without contrast 73718, 03/15/2014.

## 2014-03-14 NOTE — Telephone Encounter (Signed)
I left a message for Kevin Wheeler that the MRI was authorized.  The authorization number is [id number].  It expires on 04/28/2014.  Call if you have any questions.

## 2014-03-15 ENCOUNTER — Ambulatory Visit
Admission: RE | Admit: 2014-03-15 | Discharge: 2014-03-15 | Disposition: A | Discharge status: Home / Self Care | Payer: 59 | Source: Ambulatory Visit | Attending: Podiatrist | Admitting: Podiatrist

## 2014-03-15 DIAGNOSIS — M62179 Other rupture of muscle (nontraumatic), unspecified ankle and foot: Secondary | ICD-10-CM

## 2014-03-28 ENCOUNTER — Telehealth: Payer: Self-pay

## 2014-03-28 NOTE — Telephone Encounter (Signed)
Pt request MRI results of 03/15/2014.

## 2014-03-29 NOTE — Telephone Encounter (Signed)
MRI shows no plantar fascial rupture on the MRI.  It does show some thickening of the fascia in the area of pain consistent with plantar fasciitis.   There is also a small cyst near the heel which could be contributing to the pain (per the radiologist)-- I would recommend some Physical therapy to help with getting his foot feeling better again-- I would recommend Barbaraann Barthel. If he is interested, let me know and I will write up the rx.  Thanks!!!

## 2014-03-30 ENCOUNTER — Encounter: Payer: Self-pay | Admitting: Cardiology

## 2014-04-10 NOTE — Telephone Encounter (Signed)
I called the patient to see if someone had called him with the MRI results.  "Yes someone called me with the information."  Do you want to proceed with physical therapy?  "No, not at this time.  I'm actually doing some exercises my trainer at the gym printed up for me.  I want to see how that does."  Okay, let us know if you need [place].

## 2014-04-10 NOTE — Telephone Encounter (Signed)
-----   Message from Bronson Ing, DPM sent at 03/22/2014 11:45 AM EST ----- Please call Kevin Wheeler and let him know that his MRI did not show a tear in the plantar fascia so It is healed. (he also has a small ganglion cyst near the ball of the foot but that wouldn't contribute to the plantar heel pain)   At this point, I'd like to get him into see my favorite Physical therapist, Kevin Wheeler.  If you would mention it to him to see if he would like to do a little PT to get his foot back to normal, that would be great.  You can give him Kevin Wheeler's contact info and I can fax over the rx for his therapy if he would like to do it.  I don't need to see him for an office visit for the mri report.  Thanks!   ----- Message -----    From: Rad Results In Interface    Sent: 03/15/2014   9:55 AM      To: Bronson Ing, DPM

## 2014-04-24 ENCOUNTER — Other Ambulatory Visit: Payer: Self-pay | Admitting: Cardiology

## 2014-08-23 DIAGNOSIS — M722 Plantar fascial fibromatosis: Secondary | ICD-10-CM

## 2014-10-16 ENCOUNTER — Encounter: Payer: Self-pay | Admitting: Cardiovascular Disease

## 2014-10-17 ENCOUNTER — Encounter: Payer: Self-pay | Admitting: Cardiology

## 2015-03-26 ENCOUNTER — Ambulatory Visit (INDEPENDENT_AMBULATORY_CARE_PROVIDER_SITE_OTHER): Payer: 59 | Admitting: Cardiology

## 2015-03-26 ENCOUNTER — Ambulatory Visit: Payer: 59 | Admitting: Cardiology

## 2015-03-26 ENCOUNTER — Encounter: Payer: Self-pay | Admitting: Cardiology

## 2015-03-26 VITALS — BP 140/80 | HR 66 | Ht 69.0 in | Wt 195.0 lb

## 2015-03-26 DIAGNOSIS — E119 Type 2 diabetes mellitus without complications: Secondary | ICD-10-CM

## 2015-03-26 DIAGNOSIS — I10 Essential (primary) hypertension: Secondary | ICD-10-CM

## 2015-03-26 DIAGNOSIS — E7800 Pure hypercholesterolemia, unspecified: Secondary | ICD-10-CM

## 2015-03-26 DIAGNOSIS — I25810 Atherosclerosis of coronary artery bypass graft(s) without angina pectoris: Secondary | ICD-10-CM

## 2015-03-26 NOTE — Patient Instructions (Signed)
Continue your current therapy  We will schedule you for a nuclear stress test   

## 2015-03-26 NOTE — Progress Notes (Signed)
Kevin Wheeler Date of Birth: 1950-12-19   History of Present Illness: Kevin Wheeler is seen today for follow up of CAD. He is status post CABG in 1999. His last stress Myoview test in the fall of 2011 was normal. When last seen he had a torn plantar tendon. This has since healed. He denies any chest pain, SOB, or palpitations. Exercises regularly. Labs followed by primary care.   Current Outpatient Prescriptions on File Prior to Visit  Medication Sig Dispense Refill  . aspirin 81 MG tablet Take 81 mg by mouth daily.      Marland Kitchen atorvastatin (LIPITOR) 80 MG tablet Take 1 tablet by mouth  daily 90 tablet 3  . FLUVIRIN SUSP   0  . Multiple Vitamins-Minerals (OCUVITE PO) Take by mouth daily.    . trandolapril (MAVIK) 2 MG tablet Take 1 tablet by mouth  daily 90 tablet 3   Current Facility-Administered Medications on File Prior to Visit  Medication Dose Route Frequency Provider Last Rate Last Dose  . triamcinolone acetonide (KENALOG) 10 MG/ML injection 10 mg  10 mg Other Once Bronson Ing, DPM        No Known Allergies  Past Medical History  Diagnosis Date  . Coronary artery disease   . Hypertension   . Hypercholesteremia   . Hiatal hernia   . Diabetes mellitus type 2, noninsulin dependent ([place])     Diet controlled.  . Constipation   . Cancer (HCC)     Skin Cancers- Basal cell 3 arm 1 forehead  . Arthritis   . Macular hole   . Plantar fascia rupture     Past Surgical History  Procedure Laterality Date  . Colonoscopy    . Appendectomy    . Vasectomy    . Cardiac catheterization      Ejection fraction is estimated at 60%  . Coronary artery bypass graft  February 15, 1998    Included an LIMA graft to the LAD, saphenous vein graft sequentially to the intermediate and obtuse marginal vessels, and a sequential vein graft to the acute marginal and distal circumflex. There is also a vein graft to the diagonal  . Anterior cervical decomp/discectomy fusion  10/24/2011    Procedure:  ANTERIOR CERVICAL DECOMPRESSION/DISCECTOMY FUSION 1 LEVEL;  Surgeon: Floyce Stakes, MD;  Location: [facility];  Service: Neurosurgery;  Laterality: N/A;  Cervical three four  Anterior cervical decompression/diskectomy/fusion/Plate    History  Smoking status  . Never Smoker   Smokeless tobacco  . Not on file    History  Alcohol Use  . 4.8 oz/week  . 8 Glasses of wine per week    Family History  Problem Relation Age of Onset  . Heart disease Father     Review of Systems: As noted in history of present illness.   All other systems were reviewed and are negative.  Physical Exam: BP 140/80 mmHg  Pulse 66  Ht 5\' 9"  (1.753 m)  Wt 88.451 kg (195 lb)  BMI 28.78 kg/m2 He is a pleasant white male in no acute distress.  The HEENT exam is normal. The carotids are 2+ without bruits.  There is no thyromegaly.There is no JVD.  The lungs are clear.   The heart exam reveals a regular rate with a normal S1 and S2.  There are no murmurs, gallops, or rubs.  The PMI is not displaced.   Abdominal exam reveals good bowel sounds.      Exam of the legs  reveal no clubbing, cyanosis, or edema.  The distal pulses are intact.  Cranial nerves II - XII are intact.  Motor and sensory functions are intact.  The gait is normal.  LABORATORY DATA: ECG today is normal. Rate 66. I have personally reviewed and interpreted this study.  Labs reviewed from primary care 09/30/14: BUN 29, creatinine .98. A1c 6.4%. Glucose 113. Cholesterol 137, trig-100, HDL 45, LDL 72.  Assessment / Plan: 1. Coronary disease status post CABG in 1999. He remains asymptomatic. Last Myoview study in 2011.  Recommend continued aspirin and statin therapy. Will update a stress Myoview at this time.   2. Hypertension under good control on Mavik.  3. Hyperlipidemia-good control on lipitor.

## 2015-03-30 ENCOUNTER — Other Ambulatory Visit: Payer: Self-pay | Admitting: Cardiology

## 2015-03-30 NOTE — Telephone Encounter (Signed)
Rx request sent to pharmacy.  

## 2015-04-03 ENCOUNTER — Telehealth (HOSPITAL_COMMUNITY): Payer: Self-pay

## 2015-04-03 NOTE — Telephone Encounter (Signed)
Encounter complete. 

## 2015-04-04 ENCOUNTER — Telehealth (HOSPITAL_COMMUNITY): Payer: Self-pay

## 2015-04-04 NOTE — Telephone Encounter (Signed)
Encounter complete. 

## 2015-04-05 ENCOUNTER — Ambulatory Visit (HOSPITAL_COMMUNITY)
Admission: RE | Admit: 2015-04-05 | Discharge: 2015-04-05 | Disposition: A | Discharge status: Home / Self Care | Payer: 59 | Source: Ambulatory Visit | Attending: Cardiovascular Disease | Admitting: Cardiovascular Disease

## 2015-04-05 DIAGNOSIS — I25810 Atherosclerosis of coronary artery bypass graft(s) without angina pectoris: Secondary | ICD-10-CM

## 2015-04-05 DIAGNOSIS — R9439 Abnormal result of other cardiovascular function study: Secondary | ICD-10-CM | POA: Diagnosis not present

## 2015-04-05 DIAGNOSIS — E7800 Pure hypercholesterolemia, unspecified: Secondary | ICD-10-CM | POA: Diagnosis not present

## 2015-04-05 DIAGNOSIS — E119 Type 2 diabetes mellitus without complications: Secondary | ICD-10-CM | POA: Diagnosis not present

## 2015-04-05 DIAGNOSIS — I10 Essential (primary) hypertension: Secondary | ICD-10-CM | POA: Diagnosis not present

## 2015-04-05 DIAGNOSIS — Z8249 Family history of ischemic heart disease and other diseases of the circulatory system: Secondary | ICD-10-CM | POA: Insufficient documentation

## 2015-04-05 LAB — MYOCARDIAL PERFUSION IMAGING
CHL CUP NUCLEAR SRS: 7
CHL CUP NUCLEAR SSS: 7
CSEPHR: 103 %
CSEPPHR: 160 {beats}/min
Estimated workload: 12.6 METS
Exercise duration (min): 10 min
Exercise duration (sec): 33 s
LV dias vol: 115 mL
LVSYSVOL: 53 mL
MPHR: 154 {beats}/min
NUC STRESS TID: 1.07
RPE: 16
Rest HR: 68 {beats}/min
SDS: 0

## 2015-04-05 MED ORDER — TECHNETIUM TC 99M SESTAMIBI GENERIC - CARDIOLITE
29.8000 | Freq: Once | INTRAVENOUS | Status: AC | PRN
  Administered 2015-04-05: 29.8 via INTRAVENOUS

## 2015-04-05 MED ORDER — TECHNETIUM TC 99M SESTAMIBI GENERIC - CARDIOLITE
10.4000 | Freq: Once | INTRAVENOUS | Status: AC | PRN
  Administered 2015-04-05: 10 via INTRAVENOUS

## 2015-06-26 ENCOUNTER — Ambulatory Visit: Payer: 59 | Admitting: Dietician

## 2015-10-08 ENCOUNTER — Encounter: Payer: Self-pay | Admitting: Family Medicine

## 2016-02-14 ENCOUNTER — Other Ambulatory Visit: Payer: Self-pay | Admitting: Cardiology

## 2016-03-27 NOTE — Progress Notes (Signed)
Kevin Wheeler Date of Birth: Aug 14, 1950   History of Present Illness: Kevin Wheeler is seen today for follow up of CAD. He is status post CABG in 1999. His last stress Myoview test in Feb 2017 was low risk.  He denies any chest pain, SOB, or palpitations. Exercises regularly 5 days a week. Is due for yearly lab work with Dr. Alroy Dust next week.  Current Outpatient Prescriptions on File Prior to Visit  Medication Sig Dispense Refill  . aspirin 81 MG tablet Take 81 mg by mouth daily.      Marland Kitchen atorvastatin (LIPITOR) 80 MG tablet TAKE 1 TABLET BY MOUTH  DAILY 90 tablet 0  . FLUVIRIN SUSP   0  . Multiple Vitamins-Minerals (OCUVITE PO) Take by mouth daily.    . trandolapril (MAVIK) 2 MG tablet TAKE 1 TABLET BY MOUTH  DAILY 90 tablet 0   Current Facility-Administered Medications on File Prior to Visit  Medication Dose Route Frequency Provider Last Rate Last Dose  . triamcinolone acetonide (KENALOG) 10 MG/ML injection 10 mg  10 mg Other Once Bronson Ing, DPM        No Known Allergies  Past Medical History:  Diagnosis Date  . Arthritis   . Cancer (HCC)    Skin Cancers- Basal cell 3 arm 1 forehead  . Constipation   . Coronary artery disease   . Diabetes mellitus type 2, noninsulin dependent ([place])    Diet controlled.  . Hiatal hernia   . Hypercholesteremia   . Hypertension   . Macular hole   . Plantar fascia rupture     Past Surgical History:  Procedure Laterality Date  . ANTERIOR CERVICAL DECOMP/DISCECTOMY FUSION  10/24/2011   Procedure: ANTERIOR CERVICAL DECOMPRESSION/DISCECTOMY FUSION 1 LEVEL;  Surgeon: Floyce Stakes, MD;  Location: [facility];  Service: Neurosurgery;  Laterality: N/A;  Cervical three four  Anterior cervical decompression/diskectomy/fusion/Plate  . APPENDECTOMY    . CARDIAC CATHETERIZATION     Ejection fraction is estimated at 60%  . COLONOSCOPY    . CORONARY ARTERY BYPASS GRAFT  February 15, 1998   Included an LIMA graft to the LAD, saphenous vein graft  sequentially to the intermediate and obtuse marginal vessels, and a sequential vein graft to the acute marginal and distal circumflex. There is also a vein graft to the diagonal  . VASECTOMY      History  Smoking Status  . Never Smoker  Smokeless Tobacco  . Not on file    History  Alcohol Use  . 4.8 oz/week  . 8 Glasses of wine per week    Family History  Problem Relation Age of Onset  . Heart disease Father     Review of Systems: As noted in history of present illness.   All other systems were reviewed and are negative.  Physical Exam: BP 110/70   Pulse 66   Ht 5\' 9"  (1.753 m)   Wt 177 lb 12.8 oz (80.6 kg)   BMI 26.26 kg/m  He is a pleasant white male in no acute distress.  The HEENT exam is normal. The carotids are 2+ without bruits.  There is no thyromegaly.There is no JVD.  The lungs are clear.   The heart exam reveals a regular rate with a normal S1 and S2.  There are no murmurs, gallops, or rubs.  The PMI is not displaced.   Abdominal exam reveals good bowel sounds.      Exam of the legs reveal no clubbing, cyanosis,  or edema.  The distal pulses are intact.  Cranial nerves II - XII are intact.  Motor and sensory functions are intact.  The gait is normal.  LABORATORY DATA: ECG today is normal. Rate 66. I have personally reviewed and interpreted this study.  Lab Results  Component Value Date   WBC 20.6 (H) 10/24/2011   HGB 15.6 10/24/2011   HCT 45.1 10/24/2011   PLT 183 10/24/2011   GLUCOSE 170 (H) 10/24/2011   NA 139 10/24/2011   K 3.7 10/24/2011   CL 99 10/24/2011   CREATININE 1.04 10/24/2011   BUN 29 (H) 10/24/2011   CO2 27 10/24/2011     Labs reviewed from primary care 04/02/15:  Cholesterol 144, trig-86, HDL 45, LDL 81. Dated 10/08/15: A1c 6.1%. Glucose 102. Other chemistries normal.  Myoview 04/05/15: Study Highlights    Nuclear stress EF: 54%.  Horizontal ST segment depression ST segment depression of 0.8 mm was noted during stress in the II, III,  aVF, V6 and V5 leads.  Defect 1: There is a small defect of mild severity present in the apical anterior and apex location.  Findings consistent with ischemia.  This is a low risk study.   Low risk stress nuclear study with subtle reversible ischemia in the distal LAD artery distribution and normal left ventricular regional and global systolic function.     Assessment / Plan: 1. Coronary disease status post CABG in 1999. He remains asymptomatic. Last Myoview study in Feb 2017 was low risk. ? Mild apical ischemia but no symptoms and exercise tolerance excellent.  Recommend continued aspirin and statin therapy.   2. Hypertension under good control on Mavik.  3. Hyperlipidemia-good control on lipitor.  I will follow up in one year.

## 2016-03-28 ENCOUNTER — Ambulatory Visit (INDEPENDENT_AMBULATORY_CARE_PROVIDER_SITE_OTHER): Payer: 59 | Admitting: Cardiology

## 2016-03-28 ENCOUNTER — Encounter: Payer: Self-pay | Admitting: Cardiology

## 2016-03-28 VITALS — BP 110/70 | HR 66 | Ht 69.0 in | Wt 177.8 lb

## 2016-03-28 DIAGNOSIS — I10 Essential (primary) hypertension: Secondary | ICD-10-CM

## 2016-03-28 DIAGNOSIS — I25810 Atherosclerosis of coronary artery bypass graft(s) without angina pectoris: Secondary | ICD-10-CM

## 2016-03-28 DIAGNOSIS — E7800 Pure hypercholesterolemia, unspecified: Secondary | ICD-10-CM | POA: Diagnosis not present

## 2016-03-28 NOTE — Patient Instructions (Signed)
Continue your current therapy  I will see you in one year   

## 2016-06-30 ENCOUNTER — Other Ambulatory Visit: Payer: Self-pay | Admitting: Cardiology

## 2016-08-22 ENCOUNTER — Telehealth: Payer: Self-pay | Admitting: Cardiology

## 2016-08-22 NOTE — Telephone Encounter (Signed)
Spoke with patient regarding Trandolapril being a Tier 3 and would a less expensive blood pressure medication. He just ordered a 90 day supply so no rush Will forward to Dr Martinique for review

## 2016-08-22 NOTE — Telephone Encounter (Signed)
Pt wants to know if his medicine(Mavik) can be changed.Medicare will not pay for it.

## 2016-08-24 NOTE — Telephone Encounter (Signed)
It is an ACE inhibitor so we can switch to an alternate ACEi on his formulary. I would try lisinopril 20 mg daily  Kentley Blyden Martinique MD, [facility]

## 2016-08-26 NOTE — Telephone Encounter (Signed)
Returned call to patient.He stated he just ordered 90 day supply of generic mavik.Stated he will be going on medicare in August.Stated he will call me back when he finishes mavik and I can order lisinopril.

## 2016-10-31 DIAGNOSIS — I10 Essential (primary) hypertension: Secondary | ICD-10-CM | POA: Diagnosis not present

## 2016-10-31 DIAGNOSIS — M461 Sacroiliitis, not elsewhere classified: Secondary | ICD-10-CM | POA: Diagnosis not present

## 2016-10-31 DIAGNOSIS — E1165 Type 2 diabetes mellitus with hyperglycemia: Secondary | ICD-10-CM | POA: Diagnosis not present

## 2016-10-31 DIAGNOSIS — E7800 Pure hypercholesterolemia, unspecified: Secondary | ICD-10-CM | POA: Diagnosis not present

## 2016-10-31 DIAGNOSIS — L739 Follicular disorder, unspecified: Secondary | ICD-10-CM | POA: Diagnosis not present

## 2016-11-13 ENCOUNTER — Telehealth: Payer: Self-pay | Admitting: Cardiology

## 2016-11-13 NOTE — Telephone Encounter (Signed)
New message    Pt is calling asking for a call back, he wants to change his medication. He said he has gone from regular insurance to [employer]. Please call.

## 2016-11-13 NOTE — Telephone Encounter (Signed)
Spoke with pt, he has recently changed to medicare and his trandolapril is on tier 3 and he needs to get that changed if possible. They did not tell him what is on a lower tier. Will forward to dr Martinique to review and advise. He will need the script sent to [facility] on [address] for a 90 day supply.

## 2016-11-13 NOTE — Telephone Encounter (Signed)
Would switch to lisinopril 10 mg daily. Keep an eye on BP. If BP increases we may need to titrate dose but we will see how 10 mg does.  Mardel Grudzien Martinique MD, [facility]

## 2016-11-14 MED ORDER — LISINOPRIL 10 MG PO TABS: 10.0000 mg | ORAL_TABLET | Freq: Every day | ORAL | 3 refills | Status: DC

## 2016-11-14 NOTE — Telephone Encounter (Signed)
Returned call to patient Dr.Jordan's recommendations given. 

## 2016-11-17 ENCOUNTER — Other Ambulatory Visit: Payer: Self-pay | Admitting: Orthotics

## 2016-11-17 ENCOUNTER — Ambulatory Visit: Payer: 59 | Admitting: Podiatry

## 2016-11-18 ENCOUNTER — Telehealth: Payer: Self-pay | Admitting: Cardiology

## 2016-11-18 MED ORDER — ATORVASTATIN CALCIUM 80 MG PO TABS: 80.0000 mg | ORAL_TABLET | Freq: Every day | ORAL | 1 refills | Status: DC

## 2016-11-18 NOTE — Telephone Encounter (Signed)
Rx sent 

## 2016-11-18 NOTE — Telephone Encounter (Signed)
New message       *STAT* If patient is at the pharmacy, call can be transferred to refill team.   1. Which medications need to be refilled? (please list name of each medication and dose if known) atorvastatin 80mg   2. Which pharmacy/location (including street and city if local pharmacy) is medication to be sent to? [facility] at [address]  3. Do they need a 30 day or 90 day supply? 90 day

## 2016-11-19 DIAGNOSIS — Z23 Encounter for immunization: Secondary | ICD-10-CM | POA: Diagnosis not present

## 2017-01-02 ENCOUNTER — Encounter: Payer: Self-pay | Admitting: Podiatry

## 2017-01-02 ENCOUNTER — Ambulatory Visit (INDEPENDENT_AMBULATORY_CARE_PROVIDER_SITE_OTHER): Payer: Medicare Other | Admitting: Podiatry

## 2017-01-02 DIAGNOSIS — M722 Plantar fascial fibromatosis: Secondary | ICD-10-CM

## 2017-01-02 DIAGNOSIS — M79673 Pain in unspecified foot: Secondary | ICD-10-CM

## 2017-01-05 DIAGNOSIS — M722 Plantar fascial fibromatosis: Secondary | ICD-10-CM | POA: Insufficient documentation

## 2017-01-05 NOTE — Progress Notes (Signed)
Subjective: Kevin Wheeler presents the office today requesting new orthotics.  He states that when he does not wear the inserts he does get some pain to the right heel but overall he has been doing well.  He denies any recent injury or trauma he denies any swelling or redness.  He denies any numbness or tingling.  He has no other concerns today. Denies any systemic complaints such as fevers, chills, nausea, vomiting. No acute changes since last appointment, and no other complaints at this time.   Objective: AAO x3, NAD DP/PT pulses palpable bilaterally, CRT less than 3 seconds There is minimal tenderness palpation on the plantar medial tubercle of the calcaneus at the insertion of the plantar fascia on the right side.  The plantar fascia appears to be intact.  There is no pain with lateral compression of the calcaneus.  Achilles tendon appears to be intact.  Overall rectus foot type.  There is negative Tinel sign bilaterally.  There is no overlying edema, erythema, increased warmth. No open lesions or pre-ulcerative lesions.  No pain with calf compression, swelling, warmth, erythema  Assessment: Chronic plantar fasciitis, requesting orthotics  Plan: -All treatment options discussed with the patient including all alternatives, risks, complications.  -Today he was more for new orthotics.  We will did the same prescription that he had previously through [facility] [place]  however full-length with a new mold.  He is requesting to pairs of orthotics. -Continue stretching, rehab exercises that we discussed.  Ice the area as well. -Follow-up in 3 weeks to pick up orthotics or sooner if needed. -Patient encouraged to call the office with any questions, concerns, change in symptoms.    Trula Slade DPM

## 2017-01-06 DIAGNOSIS — M545 Low back pain: Secondary | ICD-10-CM | POA: Diagnosis not present

## 2017-01-06 DIAGNOSIS — M5136 Other intervertebral disc degeneration, lumbar region: Secondary | ICD-10-CM | POA: Diagnosis not present

## 2017-01-07 DIAGNOSIS — D485 Neoplasm of uncertain behavior of skin: Secondary | ICD-10-CM | POA: Diagnosis not present

## 2017-01-07 DIAGNOSIS — L02222 Furuncle of back [any part, except buttock]: Secondary | ICD-10-CM | POA: Diagnosis not present

## 2017-01-07 DIAGNOSIS — C44612 Basal cell carcinoma of skin of right upper limb, including shoulder: Secondary | ICD-10-CM | POA: Diagnosis not present

## 2017-01-07 DIAGNOSIS — Z85828 Personal history of other malignant neoplasm of skin: Secondary | ICD-10-CM | POA: Diagnosis not present

## 2017-01-07 DIAGNOSIS — Z08 Encounter for follow-up examination after completed treatment for malignant neoplasm: Secondary | ICD-10-CM | POA: Diagnosis not present

## 2017-01-10 DIAGNOSIS — C44612 Basal cell carcinoma of skin of right upper limb, including shoulder: Secondary | ICD-10-CM | POA: Diagnosis not present

## 2017-01-27 ENCOUNTER — Telehealth: Payer: Self-pay | Admitting: Podiatry

## 2017-01-27 DIAGNOSIS — H2513 Age-related nuclear cataract, bilateral: Secondary | ICD-10-CM | POA: Diagnosis not present

## 2017-01-27 DIAGNOSIS — E119 Type 2 diabetes mellitus without complications: Secondary | ICD-10-CM | POA: Diagnosis not present

## 2017-01-27 NOTE — Telephone Encounter (Signed)
I left message for pt to call to schedule an appt to pick up orthotics.  Pt returned call and I explained they sent dress orthotics. Pt states they were to be full length and there were 2 pair.  I called [facility] and (Chad)they asked me to send these back and they can make them full length and make the second pair just to put it in the notes.Marland KitchenMarland Kitchen

## 2017-01-29 DIAGNOSIS — C44612 Basal cell carcinoma of skin of right upper limb, including shoulder: Secondary | ICD-10-CM | POA: Diagnosis not present

## 2017-01-29 DIAGNOSIS — L905 Scar conditions and fibrosis of skin: Secondary | ICD-10-CM | POA: Diagnosis not present

## 2017-03-10 ENCOUNTER — Telehealth: Payer: Self-pay | Admitting: Podiatry

## 2017-03-10 NOTE — Telephone Encounter (Signed)
Pt returned call from yesterday and is out of town for 5 wks. He states he picked up 1 pair of orthotics previously but when he is back in town will call to schedule an appt to pick up 2nd pair and bring other pair also to make sure they fit correctly.

## 2017-04-14 ENCOUNTER — Encounter: Payer: Self-pay | Admitting: *Deleted

## 2017-04-17 NOTE — Progress Notes (Deleted)
Kevin Wheeler Date of Birth: Oct 15, 1950   History of Present Illness: Kevin Wheeler is seen today for follow up of CAD. He is status post CABG in 1999. His last stress Myoview test in Feb 2017 was low risk.  He denies any chest pain, SOB, or palpitations. Exercises regularly 5 days a week. Is due for yearly lab work with Dr. Alroy Dust next week.  Current Outpatient Medications on File Prior to Visit  Medication Sig Dispense Refill  . aspirin 81 MG tablet Take 81 mg by mouth daily.      Marland Kitchen atorvastatin (LIPITOR) 80 MG tablet Take 1 tablet (80 mg total) by mouth daily. 90 tablet 1  . FLUVIRIN SUSP   0  . lisinopril (PRINIVIL,ZESTRIL) 10 MG tablet Take 1 tablet (10 mg total) by mouth daily. 90 tablet 3  . Multiple Vitamins-Minerals (OCUVITE PO) Take by mouth daily.     Current Facility-Administered Medications on File Prior to Visit  Medication Dose Route Frequency Provider Last Rate Last Dose  . triamcinolone acetonide (KENALOG) 10 MG/ML injection 10 mg  10 mg Other Once Bronson Ing, DPM        No Known Allergies  Past Medical History:  Diagnosis Date  . Arthritis   . Cancer (HCC)    Skin Cancers- Basal cell 3 arm 1 forehead  . Constipation   . Coronary artery disease   . Diabetes mellitus type 2, noninsulin dependent ([place])    Diet controlled.  . Hiatal hernia   . Hypercholesteremia   . Hypertension   . Macular hole   . Plantar fascia rupture     Past Surgical History:  Procedure Laterality Date  . ANTERIOR CERVICAL DECOMP/DISCECTOMY FUSION  10/24/2011   Procedure: ANTERIOR CERVICAL DECOMPRESSION/DISCECTOMY FUSION 1 LEVEL;  Surgeon: Floyce Stakes, MD;  Location: [facility];  Service: Neurosurgery;  Laterality: N/A;  Cervical three four  Anterior cervical decompression/diskectomy/fusion/Plate  . APPENDECTOMY    . CARDIAC CATHETERIZATION     Ejection fraction is estimated at 60%  . COLONOSCOPY    . CORONARY ARTERY BYPASS GRAFT  February 15, 1998   Included an LIMA graft  to the LAD, saphenous vein graft sequentially to the intermediate and obtuse marginal vessels, and a sequential vein graft to the acute marginal and distal circumflex. There is also a vein graft to the diagonal  . VASECTOMY      Social History   Tobacco Use  Smoking Status Never Smoker  Smokeless Tobacco Never Used    Social History   Substance and Sexual Activity  Alcohol Use Yes  . Alcohol/week: 4.8 oz  . Types: 8 Glasses of wine per week    Family History  Problem Relation Age of Onset  . Heart disease Father   . Congestive Heart Failure Father     Review of Systems: As noted in history of present illness.   All other systems were reviewed and are negative.  Physical Exam: There were no vitals taken for this visit. He is a pleasant white male in no acute distress.  The HEENT exam is normal. The carotids are 2+ without bruits.  There is no thyromegaly.There is no JVD.  The lungs are clear.   The heart exam reveals a regular rate with a normal S1 and S2.  There are no murmurs, gallops, or rubs.  The PMI is not displaced.   Abdominal exam reveals good bowel sounds.      Exam of the legs reveal no clubbing,  cyanosis, or edema.  The distal pulses are intact.  Cranial nerves II - XII are intact.  Motor and sensory functions are intact.  The gait is normal.  LABORATORY DATA: ECG today is normal. Rate 66. I have personally reviewed and interpreted this study.  Lab Results  Component Value Date   WBC 20.6 (H) 10/24/2011   HGB 15.6 10/24/2011   HCT 45.1 10/24/2011   PLT 183 10/24/2011   GLUCOSE 170 (H) 10/24/2011   NA 139 10/24/2011   K 3.7 10/24/2011   CL 99 10/24/2011   CREATININE 1.04 10/24/2011   BUN 29 (H) 10/24/2011   CO2 27 10/24/2011     Labs reviewed from primary care 04/02/15:  Cholesterol 144, trig-86, HDL 45, LDL 81. Dated 10/08/15: A1c 6.1%. Glucose 102. Other chemistries normal.  Myoview 04/05/15: Study Highlights    Nuclear stress EF: 54%.  Horizontal ST  segment depression ST segment depression of 0.8 mm was noted during stress in the II, III, aVF, V6 and V5 leads.  Defect 1: There is a small defect of mild severity present in the apical anterior and apex location.  Findings consistent with ischemia.  This is a low risk study.   Low risk stress nuclear study with subtle reversible ischemia in the distal LAD artery distribution and normal left ventricular regional and global systolic function.     Assessment / Plan: 1. Coronary disease status post CABG in 1999. He remains asymptomatic. Last Myoview study in Feb 2017 was low risk. ? Mild apical ischemia but no symptoms and exercise tolerance excellent.  Recommend continued aspirin and statin therapy.   2. Hypertension under good control on Mavik.  3. Hyperlipidemia-good control on lipitor.  I will follow up in one year.

## 2017-04-20 ENCOUNTER — Ambulatory Visit (INDEPENDENT_AMBULATORY_CARE_PROVIDER_SITE_OTHER): Payer: Medicare Other | Admitting: Orthotics

## 2017-04-20 DIAGNOSIS — M722 Plantar fascial fibromatosis: Secondary | ICD-10-CM

## 2017-04-20 NOTE — Progress Notes (Signed)
Patient came in today to pick up custom made foot orthotics.  The goals were accomplished and the patient reported no dissatisfaction with said orthotics.  Patient was advised of breakin period and how to report any issues. 

## 2017-04-22 ENCOUNTER — Ambulatory Visit: Payer: 59 | Admitting: Cardiology

## 2017-04-27 DIAGNOSIS — I10 Essential (primary) hypertension: Secondary | ICD-10-CM | POA: Diagnosis not present

## 2017-04-27 DIAGNOSIS — L739 Follicular disorder, unspecified: Secondary | ICD-10-CM | POA: Diagnosis not present

## 2017-04-27 DIAGNOSIS — Z23 Encounter for immunization: Secondary | ICD-10-CM | POA: Diagnosis not present

## 2017-04-27 DIAGNOSIS — E119 Type 2 diabetes mellitus without complications: Secondary | ICD-10-CM | POA: Diagnosis not present

## 2017-04-27 DIAGNOSIS — Z125 Encounter for screening for malignant neoplasm of prostate: Secondary | ICD-10-CM | POA: Diagnosis not present

## 2017-04-27 DIAGNOSIS — E7800 Pure hypercholesterolemia, unspecified: Secondary | ICD-10-CM | POA: Diagnosis not present

## 2017-04-27 DIAGNOSIS — M461 Sacroiliitis, not elsewhere classified: Secondary | ICD-10-CM | POA: Diagnosis not present

## 2017-04-27 DIAGNOSIS — Z0000 Encounter for general adult medical examination without abnormal findings: Secondary | ICD-10-CM | POA: Diagnosis not present

## 2017-04-27 DIAGNOSIS — Z1159 Encounter for screening for other viral diseases: Secondary | ICD-10-CM | POA: Diagnosis not present

## 2017-05-22 ENCOUNTER — Other Ambulatory Visit: Payer: Self-pay

## 2017-05-22 DIAGNOSIS — H2513 Age-related nuclear cataract, bilateral: Secondary | ICD-10-CM | POA: Diagnosis not present

## 2017-05-22 MED ORDER — ATORVASTATIN CALCIUM 80 MG PO TABS: 80.0000 mg | ORAL_TABLET | Freq: Every day | ORAL | 0 refills | Status: DC

## 2017-05-31 NOTE — Progress Notes (Signed)
[name] Date of Birth: September 19, 1950   History of Present Illness: Kevin Wheeler is seen today for follow up of CAD. He is status post CABG in 1999. His last stress Myoview test in Feb 2017 was low risk.    He continues to do very well. He retired this year and is now on [employer]. He denies any chest pain, SOB, or palpitations. Exercises regularly.  He has no complaints.  Current Outpatient Medications on File Prior to Visit  Medication Sig Dispense Refill  . aspirin 81 MG tablet Take 81 mg by mouth daily.      Marland Kitchen atorvastatin (LIPITOR) 80 MG tablet Take 1 tablet (80 mg total) by mouth daily. 90 tablet 0  . FLUVIRIN SUSP   0  . Multiple Vitamins-Minerals (OCUVITE PO) Take by mouth daily.    Marland Kitchen lisinopril (PRINIVIL,ZESTRIL) 10 MG tablet Take 1 tablet (10 mg total) by mouth daily. 90 tablet 3   Current Facility-Administered Medications on File Prior to Visit  Medication Dose Route Frequency Provider Last Rate Last Dose  . triamcinolone acetonide (KENALOG) 10 MG/ML injection 10 mg  10 mg Other Once Bronson Ing, DPM        No Known Allergies  Past Medical History:  Diagnosis Date  . Arthritis   . Cancer (HCC)    Skin Cancers- Basal cell 3 arm 1 forehead  . Constipation   . Coronary artery disease   . Diabetes mellitus type 2, noninsulin dependent ([place])    Diet controlled.  . Hiatal hernia   . Hypercholesteremia   . Hypertension   . Macular hole   . Plantar fascia rupture     Past Surgical History:  Procedure Laterality Date  . ANTERIOR CERVICAL DECOMP/DISCECTOMY FUSION  10/24/2011   Procedure: ANTERIOR CERVICAL DECOMPRESSION/DISCECTOMY FUSION 1 LEVEL;  Surgeon: Floyce Stakes, MD;  Location: [facility];  Service: Neurosurgery;  Laterality: N/A;  Cervical three four  Anterior cervical decompression/diskectomy/fusion/Plate  . APPENDECTOMY    . CARDIAC CATHETERIZATION     Ejection fraction is estimated at 60%  . COLONOSCOPY    . CORONARY ARTERY BYPASS GRAFT  February 15, 1998   Included an LIMA graft to the LAD, saphenous vein graft sequentially to the intermediate and obtuse marginal vessels, and a sequential vein graft to the acute marginal and distal circumflex. There is also a vein graft to the diagonal  . VASECTOMY      Social History   Tobacco Use  Smoking Status Never Smoker  Smokeless Tobacco Never Used    Social History   Substance and Sexual Activity  Alcohol Use Yes  . Alcohol/week: 4.8 oz  . Types: 8 Glasses of wine per week    Family History  Problem Relation Age of Onset  . Heart disease Father   . Congestive Heart Failure Father     Review of Systems: As noted in history of present illness.   All other systems were reviewed and are negative.  Physical Exam: BP 124/70 (BP Location: Left Arm, Patient Position: Sitting, Cuff Size: Normal)   Pulse (!) 58   Ht 5\' 8"  (1.727 m)   Wt 174 lb (78.9 kg)   BMI 26.46 kg/m  GENERAL:  Well appearing WM  In NAD HEENT:  PERRL, EOMI, sclera are clear. Oropharynx is clear. NECK:  No jugular venous distention, carotid upstroke brisk and symmetric, no bruits, no thyromegaly or adenopathy LUNGS:  Clear to auscultation bilaterally CHEST:  Unremarkable HEART:  RRR,  PMI not displaced or sustained,S1 and S2 within normal limits, no S3, no S4: no clicks, no rubs, no murmurs ABD:  Soft, nontender. BS +, no masses or bruits. No hepatomegaly, no splenomegaly EXT:  2 + pulses throughout, no edema, no cyanosis no clubbing SKIN:  Warm and dry.  No rashes NEURO:  Alert and oriented x 3. Cranial nerves II through XII intact. PSYCH:  Cognitively intact    LABORATORY DATA: ECG today is normal. Rate 58. I have personally reviewed and interpreted this study.  Lab Results  Component Value Date   WBC 20.6 (H) 10/24/2011   HGB 15.6 10/24/2011   HCT 45.1 10/24/2011   PLT 183 10/24/2011   GLUCOSE 170 (H) 10/24/2011   NA 139 10/24/2011   K 3.7 10/24/2011   CL 99 10/24/2011   CREATININE 1.04  10/24/2011   BUN 29 (H) 10/24/2011   CO2 27 10/24/2011     Labs reviewed from primary care 04/02/15:  Cholesterol 144, trig-86, HDL 45, LDL 81. Dated 10/08/15: A1c 6.1%. Glucose 102. Other chemistries normal. Dated 04/27/17: cholesterol 124, triglycerides 61, HDL 47, LDL 65. A1c 5.8%. Chemistries normal.   Myoview 04/05/15: Study Highlights    Nuclear stress EF: 54%.  Horizontal ST segment depression ST segment depression of 0.8 mm was noted during stress in the II, III, aVF, V6 and V5 leads.  Defect 1: There is a small defect of mild severity present in the apical anterior and apex location.  Findings consistent with ischemia.  This is a low risk study.   Low risk stress nuclear study with subtle reversible ischemia in the distal LAD artery distribution and normal left ventricular regional and global systolic function.     Assessment / Plan: 1. Coronary disease status post CABG in 1999. He is asymptomatic. Last Myoview study in Feb 2017 was low risk. ? Mild apical ischemia but no symptoms and exercise tolerance excellent.  Recommend continued aspirin and statin therapy.   2. Hypertension well controlled.  3. Hyperlipidemia-excellent  control on lipitor.  I will follow up in one year.

## 2017-06-01 ENCOUNTER — Encounter: Payer: Self-pay | Admitting: Cardiology

## 2017-06-01 ENCOUNTER — Ambulatory Visit (INDEPENDENT_AMBULATORY_CARE_PROVIDER_SITE_OTHER): Payer: Medicare Other | Admitting: Cardiology

## 2017-06-01 VITALS — BP 124/70 | HR 58 | Ht 68.0 in | Wt 174.0 lb

## 2017-06-01 DIAGNOSIS — E7800 Pure hypercholesterolemia, unspecified: Secondary | ICD-10-CM | POA: Diagnosis not present

## 2017-06-01 DIAGNOSIS — I10 Essential (primary) hypertension: Secondary | ICD-10-CM | POA: Diagnosis not present

## 2017-06-01 DIAGNOSIS — I25810 Atherosclerosis of coronary artery bypass graft(s) without angina pectoris: Secondary | ICD-10-CM

## 2017-06-01 NOTE — Patient Instructions (Signed)
Continue your current therapy  I will see you in one year   

## 2017-07-13 DIAGNOSIS — H2513 Age-related nuclear cataract, bilateral: Secondary | ICD-10-CM | POA: Diagnosis not present

## 2017-07-13 DIAGNOSIS — H524 Presbyopia: Secondary | ICD-10-CM | POA: Diagnosis not present

## 2017-07-13 DIAGNOSIS — H25041 Posterior subcapsular polar age-related cataract, right eye: Secondary | ICD-10-CM | POA: Diagnosis not present

## 2017-08-11 DIAGNOSIS — H2511 Age-related nuclear cataract, right eye: Secondary | ICD-10-CM | POA: Diagnosis not present

## 2017-08-11 DIAGNOSIS — H25041 Posterior subcapsular polar age-related cataract, right eye: Secondary | ICD-10-CM | POA: Diagnosis not present

## 2017-08-11 DIAGNOSIS — H25811 Combined forms of age-related cataract, right eye: Secondary | ICD-10-CM | POA: Diagnosis not present

## 2017-08-21 DIAGNOSIS — Z872 Personal history of diseases of the skin and subcutaneous tissue: Secondary | ICD-10-CM | POA: Diagnosis not present

## 2017-08-21 DIAGNOSIS — Z48817 Encounter for surgical aftercare following surgery on the skin and subcutaneous tissue: Secondary | ICD-10-CM | POA: Diagnosis not present

## 2017-08-21 DIAGNOSIS — Z85828 Personal history of other malignant neoplasm of skin: Secondary | ICD-10-CM | POA: Diagnosis not present

## 2017-09-01 DIAGNOSIS — H25042 Posterior subcapsular polar age-related cataract, left eye: Secondary | ICD-10-CM | POA: Diagnosis not present

## 2017-09-01 DIAGNOSIS — H25812 Combined forms of age-related cataract, left eye: Secondary | ICD-10-CM | POA: Diagnosis not present

## 2017-09-01 DIAGNOSIS — H2512 Age-related nuclear cataract, left eye: Secondary | ICD-10-CM | POA: Diagnosis not present

## 2017-09-02 ENCOUNTER — Other Ambulatory Visit: Payer: Self-pay

## 2017-09-02 MED ORDER — ATORVASTATIN CALCIUM 80 MG PO TABS: 80.0000 mg | ORAL_TABLET | Freq: Every day | ORAL | 3 refills | Status: DC

## 2017-09-04 ENCOUNTER — Other Ambulatory Visit: Payer: Self-pay

## 2017-09-04 MED ORDER — ATORVASTATIN CALCIUM 80 MG PO TABS: 80.0000 mg | ORAL_TABLET | Freq: Every day | ORAL | 3 refills | Status: DC

## 2017-10-06 IMAGING — NM NM MISC PROCEDURE
6 series · 36 of 36 positions shown · non-contrast
Comparison: none

[Series 1: wbr rest · 6.40mm/px · 6 of 64 frames shown]
[frame 6/64]
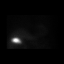
[frame 16/64]
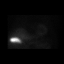
[frame 27/64]
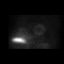
[frame 38/64]
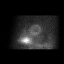
[frame 48/64]
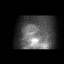
[frame 59/64]
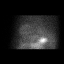

[Series 1: wbr_r-proj_st wbr rest · 6.40mm/px · 6 of 64 frames shown]
[frame 6/64]
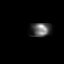
[frame 16/64]
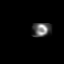
[frame 27/64]
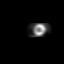
[frame 38/64]
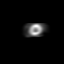
[frame 48/64]
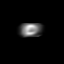
[frame 59/64]
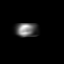

[Series 2: wbr_s-proj_st wbr stress-gsp · 6.40mm/px · 6 of 512 frames shown]
[frame 43/512]
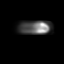
[frame 128/512]
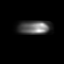
[frame 214/512]
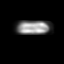
[frame 299/512]
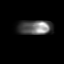
[frame 384/512]
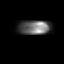
[frame 470/512]
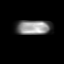

[Series 2: wbr stress-gsp · 6.40mm/px · 6 of 511 frames shown]
[frame 43/511]
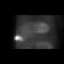
[frame 128/511]
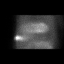
[frame 213/511]
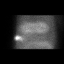
[frame 298/511]
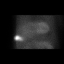
[frame 383/511]
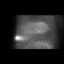
[frame 469/511]
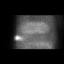

[Series 3: wbr stress-sum-em · 6.40mm/px · 6 of 64 frames shown]
[frame 6/64]
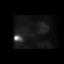
[frame 16/64]
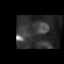
[frame 27/64]
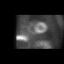
[frame 38/64]
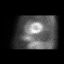
[frame 48/64]
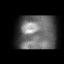
[frame 59/64]
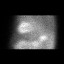

[Series 3: wbr_s-proj_st wbr stress-sum-em · 6.40mm/px · 6 of 64 frames shown]
[frame 6/64]
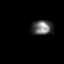
[frame 16/64]
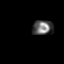
[frame 27/64]
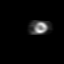
[frame 38/64]
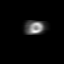
[frame 48/64]
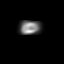
[frame 59/64]
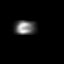

[36 of 36 positions shown; findings below may reference images not displayed]

Canned report from images found in remote index.

Refer to host system for actual result text.

## 2017-10-28 DIAGNOSIS — L739 Follicular disorder, unspecified: Secondary | ICD-10-CM | POA: Diagnosis not present

## 2017-10-28 DIAGNOSIS — I10 Essential (primary) hypertension: Secondary | ICD-10-CM | POA: Diagnosis not present

## 2017-10-28 DIAGNOSIS — E119 Type 2 diabetes mellitus without complications: Secondary | ICD-10-CM | POA: Diagnosis not present

## 2017-10-28 DIAGNOSIS — J069 Acute upper respiratory infection, unspecified: Secondary | ICD-10-CM | POA: Diagnosis not present

## 2017-10-28 DIAGNOSIS — M2550 Pain in unspecified joint: Secondary | ICD-10-CM | POA: Diagnosis not present

## 2017-10-28 DIAGNOSIS — E7800 Pure hypercholesterolemia, unspecified: Secondary | ICD-10-CM | POA: Diagnosis not present

## 2017-11-06 ENCOUNTER — Other Ambulatory Visit: Payer: Self-pay | Admitting: Cardiology

## 2017-12-15 DIAGNOSIS — Z23 Encounter for immunization: Secondary | ICD-10-CM | POA: Diagnosis not present

## 2018-04-26 ENCOUNTER — Telehealth: Payer: Self-pay | Admitting: Cardiology

## 2018-04-26 NOTE — Telephone Encounter (Signed)
FORWARD TO DR Martinique

## 2018-04-26 NOTE — Telephone Encounter (Signed)
New Message   Patient traveling to [place] and wants to make sure its ok for him to travel.

## 2018-04-26 NOTE — Telephone Encounter (Signed)
Left message per Dr Martinique responds. Any question may call back

## 2018-04-26 NOTE — Telephone Encounter (Signed)
OK from my standpoint unless government restricts travel due to coronavirus  Peter Martinique MD, [facility]

## 2018-04-26 NOTE — Telephone Encounter (Signed)
SPOKE TO PATIENT -  He would be going to [place] at the end of April 2020. Patient states if  Dr Martinique thinkd he shouldn't go, he may need a letter from Dr Martinique for cancellation. Patient states he will make decision once he reciev inforamtion

## 2018-05-20 DIAGNOSIS — Z0000 Encounter for general adult medical examination without abnormal findings: Secondary | ICD-10-CM | POA: Diagnosis not present

## 2018-05-20 DIAGNOSIS — N529 Male erectile dysfunction, unspecified: Secondary | ICD-10-CM | POA: Diagnosis not present

## 2018-05-20 DIAGNOSIS — E7800 Pure hypercholesterolemia, unspecified: Secondary | ICD-10-CM | POA: Diagnosis not present

## 2018-05-20 DIAGNOSIS — E119 Type 2 diabetes mellitus without complications: Secondary | ICD-10-CM | POA: Diagnosis not present

## 2018-05-20 DIAGNOSIS — I10 Essential (primary) hypertension: Secondary | ICD-10-CM | POA: Diagnosis not present

## 2018-05-20 DIAGNOSIS — Z125 Encounter for screening for malignant neoplasm of prostate: Secondary | ICD-10-CM | POA: Diagnosis not present

## 2018-06-08 ENCOUNTER — Ambulatory Visit: Payer: Medicare Other | Admitting: Cardiology

## 2018-08-31 DIAGNOSIS — L821 Other seborrheic keratosis: Secondary | ICD-10-CM | POA: Diagnosis not present

## 2018-08-31 DIAGNOSIS — L237 Allergic contact dermatitis due to plants, except food: Secondary | ICD-10-CM | POA: Diagnosis not present

## 2018-08-31 DIAGNOSIS — D0461 Carcinoma in situ of skin of right upper limb, including shoulder: Secondary | ICD-10-CM | POA: Diagnosis not present

## 2018-08-31 DIAGNOSIS — L739 Follicular disorder, unspecified: Secondary | ICD-10-CM | POA: Diagnosis not present

## 2018-08-31 DIAGNOSIS — D0462 Carcinoma in situ of skin of left upper limb, including shoulder: Secondary | ICD-10-CM | POA: Diagnosis not present

## 2018-08-31 DIAGNOSIS — D2261 Melanocytic nevi of right upper limb, including shoulder: Secondary | ICD-10-CM | POA: Diagnosis not present

## 2018-08-31 DIAGNOSIS — D485 Neoplasm of uncertain behavior of skin: Secondary | ICD-10-CM | POA: Diagnosis not present

## 2018-08-31 DIAGNOSIS — I10 Essential (primary) hypertension: Secondary | ICD-10-CM | POA: Diagnosis not present

## 2018-09-15 DIAGNOSIS — D0461 Carcinoma in situ of skin of right upper limb, including shoulder: Secondary | ICD-10-CM | POA: Diagnosis not present

## 2018-10-12 DIAGNOSIS — L814 Other melanin hyperpigmentation: Secondary | ICD-10-CM | POA: Diagnosis not present

## 2018-10-12 DIAGNOSIS — D1801 Hemangioma of skin and subcutaneous tissue: Secondary | ICD-10-CM | POA: Diagnosis not present

## 2018-10-12 DIAGNOSIS — D2262 Melanocytic nevi of left upper limb, including shoulder: Secondary | ICD-10-CM | POA: Diagnosis not present

## 2018-10-12 DIAGNOSIS — D2272 Melanocytic nevi of left lower limb, including hip: Secondary | ICD-10-CM | POA: Diagnosis not present

## 2018-10-12 DIAGNOSIS — Z86007 Personal history of in-situ neoplasm of skin: Secondary | ICD-10-CM | POA: Diagnosis not present

## 2018-10-12 DIAGNOSIS — L821 Other seborrheic keratosis: Secondary | ICD-10-CM | POA: Diagnosis not present

## 2018-10-12 DIAGNOSIS — D485 Neoplasm of uncertain behavior of skin: Secondary | ICD-10-CM | POA: Diagnosis not present

## 2018-10-12 DIAGNOSIS — L570 Actinic keratosis: Secondary | ICD-10-CM | POA: Diagnosis not present

## 2018-10-20 NOTE — Progress Notes (Signed)
Virtual Visit via Video Note   This visit type was conducted due to national recommendations for restrictions regarding the COVID-19 Pandemic (e.g. social distancing) in an effort to limit this patient's exposure and mitigate transmission in our community.  Due to his co-morbid illnesses, this patient is at least at moderate risk for complications without adequate follow up.  This format is felt to be most appropriate for this patient at this time.  All issues noted in this document were discussed and addressed.  A limited physical exam was performed with this format.  Please refer to the patient's chart for his consent to telehealth for [facility]   Date:  10/21/2018   ID:  Kevin Wheeler, DOB 29-May-1950, MRN [id number]  Patient Location: Home Provider Location: Home  PCP:  Kevin Wheeler, Carlean Jews.Kevin Sa, MD  Cardiologist:  Kevin Trippe Martinique MD Electrophysiologist:  None   Evaluation Performed:  Follow-Up Visit  Chief Complaint:  Follow up CAD.   History of Present Illness:    Kevin Wheeler is a 68 y.o. male with a history of CAD. He is status post CABG in 1999. His last stress Myoview test in Feb 2017 was low risk.    On follow up today he is doing very well. Denies any chest pain, SOB, dizziness, palpitations, edema. Walking 4-5 miles per day. No complaints. He has lost about 12 lbs.   The patient does not have symptoms concerning for COVID-19 infection (fever, chills, cough, or new shortness of breath).    Past Medical History:  Diagnosis Date  . Arthritis   . Cancer (HCC)    Skin Cancers- Basal cell 3 arm 1 forehead  . Constipation   . Coronary artery disease   . Diabetes mellitus type 2, noninsulin dependent ([place])    Diet controlled.  . Hiatal hernia   . Hypercholesteremia   . Hypertension   . Macular hole   . Plantar fascia rupture    Past Surgical History:  Procedure Laterality Date  . ANTERIOR CERVICAL DECOMP/DISCECTOMY FUSION  10/24/2011   Procedure: ANTERIOR CERVICAL  DECOMPRESSION/DISCECTOMY FUSION 1 LEVEL;  Surgeon: Floyce Stakes, MD;  Location: [facility];  Service: Neurosurgery;  Laterality: N/A;  Cervical three four  Anterior cervical decompression/diskectomy/fusion/Plate  . APPENDECTOMY    . CARDIAC CATHETERIZATION     Ejection fraction is estimated at 60%  . COLONOSCOPY    . CORONARY ARTERY BYPASS GRAFT  February 15, 1998   Included an LIMA graft to the LAD, saphenous vein graft sequentially to the intermediate and obtuse marginal vessels, and a sequential vein graft to the acute marginal and distal circumflex. There is also a vein graft to the diagonal  . VASECTOMY       Current Meds  Medication Sig  . aspirin 81 MG tablet Take 81 mg by mouth daily.    Marland Kitchen atorvastatin (LIPITOR) 80 MG tablet Take 1 tablet (80 mg total) by mouth daily.  Marland Kitchen lisinopril (PRINIVIL,ZESTRIL) 10 MG tablet TAKE 1 TABLET(10 MG) BY MOUTH DAILY  . Multiple Vitamins-Minerals (PRESERVISION AREDS 2 PO) Take 1 capsule by mouth 2 (two) times daily.   Current Facility-Administered Medications for the 10/21/18 encounter (Telemedicine) with Wheeler, Taronda Comacho M, MD  Medication  . triamcinolone acetonide (KENALOG) 10 MG/ML injection 10 mg     Allergies:   Patient has no known allergies.   Social History   Tobacco Use  . Smoking status: Never Smoker  . Smokeless tobacco: Never Used  Substance Use Topics  . Alcohol  use: Yes    Alcohol/week: 8.0 standard drinks    Types: 8 Glasses of wine per week  . Drug use: No     Family Hx: The patient's family history includes Congestive Heart Failure in his father; Heart disease in his father.  ROS:   Please see the history of present illness.    All other systems reviewed and are negative.   Prior CV studies:   The following studies were reviewed today:  Myoview 04/05/15: Study Highlights    Nuclear stress EF: 54%.  Horizontal ST segment depression ST segment depression of 0.8 mm was noted during stress in the II, III, aVF,  V6 and V5 leads.  Defect 1: There is a small defect of mild severity present in the apical anterior and apex location.  Findings consistent with ischemia.  This is a low risk study.  Low risk stress nuclear study with subtle reversible ischemia in the distal LAD artery distribution and normal left ventricular regional and global systolic function.      Labs/Other Tests and Data Reviewed:    EKG:  No ECG reviewed.  Recent Labs: No results found for requested labs within last 8760 hours.   Recent Lipid Panel No results found for: CHOL, TRIG, HDL, CHOLHDL, LDLCALC, LDLDIRECT   Labs dated 05/20/18: cholesterol 115, triglycerides 56, HDL 48, LDL 56. A1c 6.0%. CMET normal.   Wt Readings from Last 3 Encounters:  10/21/18 161 lb (73 kg)  06/01/17 174 lb (78.9 kg)  03/28/16 177 lb 12.8 oz (80.6 kg)     Objective:    Vital Signs:  Ht 5\' 8"  (1.727 m)   Wt 161 lb (73 kg)   BMI 24.48 kg/m    VITAL SIGNS:  reviewed  General NAD HEENT normal Respirations unlabored.  Neuro alert and oriented x 3. Non focal Mood good.   ASSESSMENT & PLAN:    1. Coronary disease status post CABG in 1999. He is asymptomatic. Last Myoview study in Feb 2017 was low risk. ? Mild apical ischemia but no symptoms and exercise tolerance excellent.  Recommend continued aspirin and statin therapy.   2. Hypertension well controlled.  3. Hyperlipidemia-excellent  control on lipitor.  COVID-19 Education: The signs and symptoms of COVID-19 were discussed with the patient and how to seek care for testing (follow up with PCP or arrange E-visit).  The importance of social distancing was discussed today.  Time:   Today, I have spent 10 minutes with the patient with telehealth technology discussing the above problems.     Medication Adjustments/Labs and Tests Ordered: Current medicines are reviewed at length with the patient today.  Concerns regarding medicines are outlined above.   Tests Ordered: No  orders of the defined types were placed in this encounter.   Medication Changes: No orders of the defined types were placed in this encounter.   Follow Up:  In Person in 1 year(s)  Signed, Emmanual Gauthreaux Martinique, MD  10/21/2018 8:45 AM    [place]

## 2018-10-21 ENCOUNTER — Encounter: Payer: Self-pay | Admitting: Cardiology

## 2018-10-21 ENCOUNTER — Telehealth (INDEPENDENT_AMBULATORY_CARE_PROVIDER_SITE_OTHER): Payer: Medicare Other | Admitting: Cardiology

## 2018-10-21 VITALS — Ht 68.0 in | Wt 161.0 lb

## 2018-10-21 DIAGNOSIS — E7800 Pure hypercholesterolemia, unspecified: Secondary | ICD-10-CM

## 2018-10-21 DIAGNOSIS — I25810 Atherosclerosis of coronary artery bypass graft(s) without angina pectoris: Secondary | ICD-10-CM | POA: Diagnosis not present

## 2018-10-21 DIAGNOSIS — I10 Essential (primary) hypertension: Secondary | ICD-10-CM

## 2018-10-21 NOTE — Patient Instructions (Signed)
Medication Instructions:  Continue same medications If you need a refill on your cardiac medications before your next appointment, please call your pharmacy.   Lab work: None ordered   Testing/Procedures: None ordered  Follow-Up: At [facility], you and your health needs are our priority.  As part of our continuing mission to provide you with exceptional heart care, we have created designated Provider Care Teams.  These Care Teams include your primary Cardiologist (physician) and Advanced Practice Providers (APPs -  Physician Assistants and Nurse Practitioners) who all work together to provide you with the care you need, when you need it. . Schedule follow up appointment in 1 year  Call in May to schedule August appointment    

## 2018-11-02 DIAGNOSIS — H52203 Unspecified astigmatism, bilateral: Secondary | ICD-10-CM | POA: Diagnosis not present

## 2018-11-02 DIAGNOSIS — H353132 Nonexudative age-related macular degeneration, bilateral, intermediate dry stage: Secondary | ICD-10-CM | POA: Diagnosis not present

## 2018-11-02 DIAGNOSIS — Z961 Presence of intraocular lens: Secondary | ICD-10-CM | POA: Diagnosis not present

## 2018-11-02 DIAGNOSIS — E119 Type 2 diabetes mellitus without complications: Secondary | ICD-10-CM | POA: Diagnosis not present

## 2018-11-04 DIAGNOSIS — Z23 Encounter for immunization: Secondary | ICD-10-CM | POA: Diagnosis not present

## 2018-11-22 DIAGNOSIS — E119 Type 2 diabetes mellitus without complications: Secondary | ICD-10-CM | POA: Diagnosis not present

## 2018-11-22 DIAGNOSIS — E7800 Pure hypercholesterolemia, unspecified: Secondary | ICD-10-CM | POA: Diagnosis not present

## 2018-11-22 DIAGNOSIS — N529 Male erectile dysfunction, unspecified: Secondary | ICD-10-CM | POA: Diagnosis not present

## 2018-11-22 DIAGNOSIS — L739 Follicular disorder, unspecified: Secondary | ICD-10-CM | POA: Diagnosis not present

## 2018-11-22 DIAGNOSIS — M2550 Pain in unspecified joint: Secondary | ICD-10-CM | POA: Diagnosis not present

## 2018-11-22 DIAGNOSIS — I10 Essential (primary) hypertension: Secondary | ICD-10-CM | POA: Diagnosis not present

## 2018-11-30 ENCOUNTER — Other Ambulatory Visit: Payer: Self-pay | Admitting: Cardiology

## 2018-12-02 ENCOUNTER — Other Ambulatory Visit: Payer: Self-pay | Admitting: Cardiology

## 2018-12-02 MED ORDER — ATORVASTATIN CALCIUM 80 MG PO TABS: 80.0000 mg | ORAL_TABLET | Freq: Every day | ORAL | 3 refills | Status: DC

## 2018-12-02 NOTE — Telephone Encounter (Signed)
Pt's medication was sent to pt's pharmacy as requested. Confirmation received.  °

## 2019-03-22 ENCOUNTER — Ambulatory Visit: Payer: Medicare Other

## 2019-03-31 ENCOUNTER — Ambulatory Visit: Payer: Medicare Other | Attending: Internal Medicine

## 2019-03-31 DIAGNOSIS — Z23 Encounter for immunization: Secondary | ICD-10-CM | POA: Insufficient documentation

## 2019-03-31 NOTE — Progress Notes (Signed)
   Covid-19 Vaccination Clinic  Name:  Kevin Wheeler    MRN: [id number] DOB: 06/24/1950  03/31/2019  [name] was observed post Covid-19 immunization for 15 minutes without incidence. She was provided with Vaccine Information Sheet and instruction to access the V-Safe system.   [name] was instructed to call 911 with any severe reactions post vaccine: Marland Kitchen Difficulty breathing  . Swelling of your face and throat  . A fast heartbeat  . A bad rash all over your body  . Dizziness and weakness    Immunizations Administered    Name Date Dose VIS Date Route   Pfizer COVID-19 Vaccine 03/31/2019 10:02 AM 0.3 mL 02/04/2019 Intramuscular   Manufacturer: [place]   Lot: [id number]   [place]: [id number]

## 2019-04-12 ENCOUNTER — Ambulatory Visit: Payer: Medicare Other

## 2019-04-25 ENCOUNTER — Ambulatory Visit: Payer: Medicare Other | Attending: Internal Medicine

## 2019-04-25 DIAGNOSIS — Z23 Encounter for immunization: Secondary | ICD-10-CM

## 2019-04-25 NOTE — Progress Notes (Signed)
   Covid-19 Vaccination Clinic  Name:  Kevin Wheeler    MRN: [id number] DOB: [birth year]  04/25/2019  Mr. Ring was observed post Covid-19 immunization for 15 minutes without incidence. He was provided with Vaccine Information Sheet and instruction to access the V-Safe system.   [name] was instructed to call 911 with any severe reactions post vaccine: Marland Kitchen Difficulty breathing  . Swelling of your face and throat  . A fast heartbeat  . A bad rash all over your body  . Dizziness and weakness    Immunizations Administered    Name Date Dose VIS Date Route   Pfizer COVID-19 Vaccine 04/25/2019  2:13 PM 0.3 mL 02/04/2019 Intramuscular   Manufacturer: [place]   Lot: [id number]   [place]: [id number]

## 2019-06-03 DIAGNOSIS — Z125 Encounter for screening for malignant neoplasm of prostate: Secondary | ICD-10-CM | POA: Diagnosis not present

## 2019-06-03 DIAGNOSIS — I10 Essential (primary) hypertension: Secondary | ICD-10-CM | POA: Diagnosis not present

## 2019-06-03 DIAGNOSIS — Z23 Encounter for immunization: Secondary | ICD-10-CM | POA: Diagnosis not present

## 2019-06-03 DIAGNOSIS — Z0000 Encounter for general adult medical examination without abnormal findings: Secondary | ICD-10-CM | POA: Diagnosis not present

## 2019-06-03 DIAGNOSIS — E119 Type 2 diabetes mellitus without complications: Secondary | ICD-10-CM | POA: Diagnosis not present

## 2019-06-03 DIAGNOSIS — E7800 Pure hypercholesterolemia, unspecified: Secondary | ICD-10-CM | POA: Diagnosis not present

## 2019-10-10 ENCOUNTER — Ambulatory Visit (INDEPENDENT_AMBULATORY_CARE_PROVIDER_SITE_OTHER): Payer: Medicare Other | Admitting: Podiatrist

## 2019-10-10 ENCOUNTER — Other Ambulatory Visit: Payer: Self-pay

## 2019-10-10 ENCOUNTER — Encounter: Payer: Self-pay | Admitting: Podiatrist

## 2019-10-10 DIAGNOSIS — B351 Tinea unguium: Secondary | ICD-10-CM | POA: Diagnosis not present

## 2019-10-10 DIAGNOSIS — L603 Nail dystrophy: Secondary | ICD-10-CM

## 2019-10-14 NOTE — Progress Notes (Signed)
Chief Complaint  Patient presents with  . Nail Problem    Bilateral hallux. x1-2 months. L hallux nail is thickened, discolored, and lifting; R hallux nail is thick, discolored, bleeding, and "very tender". x1-2 months. Pt soaks in Epsom salt BID.     HPI: Patient is 69 y.o. male who presents today for the concerns as listed above. Patient presents with his wife and relates issues with his nails. He states the right great toenail is giving him trouble as it is bleeding and is very tender for the last couple of months. He has been soaking with no resolution of symptoms.   Patient Active Problem List   Diagnosis Date Noted  . Plantar fasciitis 01/05/2017  . Macular pattern dystrophy 12/27/2013  . Nuclear cataract of both eyes 12/27/2013  . Coronary artery disease   . Hypertension   . Hypercholesteremia   . Diabetes mellitus type 2, noninsulin dependent ([place])     Current Outpatient Medications on File Prior to Visit  Medication Sig Dispense Refill  . aspirin 81 MG tablet Take 81 mg by mouth daily.      Marland Kitchen atorvastatin (LIPITOR) 80 MG tablet Take 1 tablet (80 mg total) by mouth daily. 90 tablet 3  . lisinopril (ZESTRIL) 10 MG tablet TAKE 1 TABLET(10 MG) BY MOUTH DAILY 90 tablet 3  . Multiple Vitamins-Minerals (PRESERVISION AREDS 2 PO) Take 1 capsule by mouth 2 (two) times daily.    Marland Kitchen SHINGRIX injection      Current Facility-Administered Medications on File Prior to Visit  Medication Dose Route Frequency Provider Last Rate Last Admin  . triamcinolone acetonide (KENALOG) 10 MG/ML injection 10 mg  10 mg Other Once Bronson Ing, DPM        No Known Allergies  Review of Systems No fevers, chills, nausea, muscle aches, no difficulty breathing, no calf pain, no chest pain or shortness of breath.   Physical Exam  GENERAL APPEARANCE: Alert, conversant. Appropriately groomed. No acute distress.   VASCULAR: Pedal pulses palpable DP and PT bilateral.  Capillary refill time is  immediate to all digits,  Proximal to distal cooling it warm to warm.  Digital perfusion adequate.   NEUROLOGIC: sensation is intact epicritically and protectively to 5.07 monofilament at 5/5 sites bilateral.  Light touch is intact bilateral, vibratory sensation intact bilateral, achilles tendon reflex is intact bilateral.   MUSCULOSKELETAL: acceptable muscle strength, tone and stability bilateral.  No gross boney pedal deformities noted.  No pain, crepitus or limitation noted with foot and ankle range of motion bilateral. Plantar fasciitis symptomatology is under control at this visit with no pain elicited.   DERMATOLOGIC: skin is warm, supple, and dry.  No open lesions noted.  No rash, no pre ulcerative lesions. Left hallux is thickened, discolored, yellow with subungual debris present.  Right hallux nail appears separated from the underlying nail bed and the lateral portion of the nail is loose and digging into the lateral nail fold.  No pus or pirulence expressed.  Some redness around the nail fold is noted.     Assessment     ICD-10-CM   1. Nail dystrophy  L60.3 Fungus Culture with Smear     Plan  Discussed treatment options and recommended taking a sample of the left hallux nail to test for fungus.  We will call with the results and recommendations on treatment options.  I carefully debrided the loose right hallux from the nail bed and applied antibiotic ointment and a dressing to the  toe. He was instructed to begin epsom salts for the next few days and keep covered as the new nail grows out. He is also instructed to cut back the nail as it grows.   I looked at his orthotics and they appear to be in good condition and contour against his foot and arch well, therefore I recommended he can hold off on a new pair of orthotics at this time.    We will call with results of fungal culture when available.

## 2019-10-18 ENCOUNTER — Telehealth: Payer: Self-pay

## 2019-10-18 NOTE — Telephone Encounter (Signed)
Pt was in the office on October 10, 2019 to see Dr. Valentina Lucks. A nail sample was collected and seen. Pt would like an update on the status of the nail sample.

## 2019-10-19 NOTE — Telephone Encounter (Signed)
I'll call [facility] to follow up

## 2019-10-25 ENCOUNTER — Other Ambulatory Visit (INDEPENDENT_AMBULATORY_CARE_PROVIDER_SITE_OTHER): Payer: Medicare Other | Admitting: Podiatrist

## 2019-10-25 DIAGNOSIS — B351 Tinea unguium: Secondary | ICD-10-CM

## 2019-10-25 NOTE — Progress Notes (Signed)
[facility] report is back and reveals Onychomycosis due to Trichophyton Mentagrophytes- susceptible to oral terbinafine.   Lab work for a Hepatic panel is ordered today.  When results come back if all is OK will call in Lamisil.

## 2019-10-26 LAB — HEPATIC FUNCTION PANEL
AG Ratio: 2 (calc) (ref 1.0–2.5)
ALT: 16 U/L (ref 9–46)
AST: 19 U/L (ref 10–35)
Albumin: 4.5 g/dL (ref 3.6–5.1)
Alkaline phosphatase (APISO): 79 U/L (ref 35–144)
Bilirubin, Direct: 0.2 mg/dL (ref 0.0–0.2)
Globulin: 2.3 g/dL (calc) (ref 1.9–3.7)
Indirect Bilirubin: 0.6 mg/dL (calc) (ref 0.2–1.2)
Total Bilirubin: 0.8 mg/dL (ref 0.2–1.2)
Total Protein: 6.8 g/dL (ref 6.1–8.1)

## 2019-10-27 ENCOUNTER — Encounter: Payer: Self-pay | Admitting: Podiatrist

## 2019-10-27 ENCOUNTER — Other Ambulatory Visit (INDEPENDENT_AMBULATORY_CARE_PROVIDER_SITE_OTHER): Payer: Medicare Other | Admitting: Podiatrist

## 2019-10-27 ENCOUNTER — Other Ambulatory Visit: Payer: Self-pay | Admitting: Podiatrist

## 2019-10-27 MED ORDER — TERBINAFINE HCL 250 MG PO TABS: 250.0000 mg | ORAL_TABLET | Freq: Every day | ORAL | 0 refills | Status: DC

## 2019-10-27 MED ORDER — TERBINAFINE HCL 250 MG PO TABS
250.0000 mg | ORAL_TABLET | Freq: Every day | ORAL | 0 refills | Status: DC
Start: 2019-10-27 — End: 2021-04-09

## 2019-10-27 NOTE — Progress Notes (Signed)
Called in Lamisil to [facility] on [address]. I spoke with Kevin Wheeler and told him his labs look great and he can go ahead and start taking the medication.  We will do repeat labs in 30 days.
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
## 2019-11-02 ENCOUNTER — Other Ambulatory Visit: Payer: Self-pay

## 2019-11-02 ENCOUNTER — Encounter: Payer: Self-pay | Admitting: Cardiology

## 2019-11-02 ENCOUNTER — Ambulatory Visit (INDEPENDENT_AMBULATORY_CARE_PROVIDER_SITE_OTHER): Payer: Medicare Other | Admitting: Cardiology

## 2019-11-02 VITALS — BP 120/78 | HR 66 | Ht 68.0 in | Wt 165.0 lb

## 2019-11-02 DIAGNOSIS — L578 Other skin changes due to chronic exposure to nonionizing radiation: Secondary | ICD-10-CM | POA: Diagnosis not present

## 2019-11-02 DIAGNOSIS — E7800 Pure hypercholesterolemia, unspecified: Secondary | ICD-10-CM

## 2019-11-02 DIAGNOSIS — D1801 Hemangioma of skin and subcutaneous tissue: Secondary | ICD-10-CM | POA: Diagnosis not present

## 2019-11-02 DIAGNOSIS — L570 Actinic keratosis: Secondary | ICD-10-CM | POA: Diagnosis not present

## 2019-11-02 DIAGNOSIS — I10 Essential (primary) hypertension: Secondary | ICD-10-CM | POA: Diagnosis not present

## 2019-11-02 DIAGNOSIS — I25810 Atherosclerosis of coronary artery bypass graft(s) without angina pectoris: Secondary | ICD-10-CM | POA: Diagnosis not present

## 2019-11-02 DIAGNOSIS — L821 Other seborrheic keratosis: Secondary | ICD-10-CM | POA: Diagnosis not present

## 2019-11-02 DIAGNOSIS — Z86007 Personal history of in-situ neoplasm of skin: Secondary | ICD-10-CM | POA: Diagnosis not present

## 2019-11-02 DIAGNOSIS — D2272 Melanocytic nevi of left lower limb, including hip: Secondary | ICD-10-CM | POA: Diagnosis not present

## 2019-11-02 DIAGNOSIS — L814 Other melanin hyperpigmentation: Secondary | ICD-10-CM | POA: Diagnosis not present

## 2019-11-02 MED ORDER — ATORVASTATIN CALCIUM 80 MG PO TABS
80.0000 mg | ORAL_TABLET | Freq: Every day | ORAL | 3 refills | Status: DC
Start: 2019-11-02 — End: 2020-12-03

## 2019-11-02 MED ORDER — LISINOPRIL 10 MG PO TABS
10.0000 mg | ORAL_TABLET | Freq: Every day | ORAL | 3 refills | Status: DC
Start: 2019-11-02 — End: 2020-12-03

## 2019-11-03 DIAGNOSIS — H524 Presbyopia: Secondary | ICD-10-CM | POA: Diagnosis not present

## 2019-11-03 DIAGNOSIS — Z961 Presence of intraocular lens: Secondary | ICD-10-CM | POA: Diagnosis not present

## 2019-11-03 DIAGNOSIS — H353132 Nonexudative age-related macular degeneration, bilateral, intermediate dry stage: Secondary | ICD-10-CM | POA: Diagnosis not present

## 2019-11-03 DIAGNOSIS — H26493 Other secondary cataract, bilateral: Secondary | ICD-10-CM | POA: Diagnosis not present

## 2019-11-07 ENCOUNTER — Ambulatory Visit: Payer: Medicare Other | Admitting: Podiatrist

## 2019-11-29 DIAGNOSIS — Z23 Encounter for immunization: Secondary | ICD-10-CM | POA: Diagnosis not present

## 2019-12-01 ENCOUNTER — Other Ambulatory Visit: Payer: Self-pay | Admitting: Podiatrist

## 2019-12-01 DIAGNOSIS — B351 Tinea unguium: Secondary | ICD-10-CM

## 2019-12-06 DIAGNOSIS — B351 Tinea unguium: Secondary | ICD-10-CM | POA: Diagnosis not present

## 2019-12-07 DIAGNOSIS — I10 Essential (primary) hypertension: Secondary | ICD-10-CM | POA: Diagnosis not present

## 2019-12-07 DIAGNOSIS — Z23 Encounter for immunization: Secondary | ICD-10-CM | POA: Diagnosis not present

## 2019-12-07 DIAGNOSIS — E119 Type 2 diabetes mellitus without complications: Secondary | ICD-10-CM | POA: Diagnosis not present

## 2019-12-07 DIAGNOSIS — E7800 Pure hypercholesterolemia, unspecified: Secondary | ICD-10-CM | POA: Diagnosis not present

## 2019-12-07 DIAGNOSIS — L739 Follicular disorder, unspecified: Secondary | ICD-10-CM | POA: Diagnosis not present

## 2019-12-07 LAB — HEPATIC FUNCTION PANEL
AG Ratio: 2 (calc) (ref 1.0–2.5)
ALT: 16 U/L (ref 9–46)
AST: 18 U/L (ref 10–35)
Albumin: 4.5 g/dL (ref 3.6–5.1)
Alkaline phosphatase (APISO): 82 U/L (ref 35–144)
Bilirubin, Direct: 0.2 mg/dL (ref 0.0–0.2)
Globulin: 2.3 g/dL (calc) (ref 1.9–3.7)
Indirect Bilirubin: 0.6 mg/dL (calc) (ref 0.2–1.2)
Total Bilirubin: 0.8 mg/dL (ref 0.2–1.2)
Total Protein: 6.8 g/dL (ref 6.1–8.1)

## 2020-06-13 DIAGNOSIS — I10 Essential (primary) hypertension: Secondary | ICD-10-CM | POA: Diagnosis not present

## 2020-06-13 DIAGNOSIS — Z0000 Encounter for general adult medical examination without abnormal findings: Secondary | ICD-10-CM | POA: Diagnosis not present

## 2020-06-13 DIAGNOSIS — Z125 Encounter for screening for malignant neoplasm of prostate: Secondary | ICD-10-CM | POA: Diagnosis not present

## 2020-06-13 DIAGNOSIS — E7800 Pure hypercholesterolemia, unspecified: Secondary | ICD-10-CM | POA: Diagnosis not present

## 2020-06-13 DIAGNOSIS — M25551 Pain in right hip: Secondary | ICD-10-CM | POA: Diagnosis not present

## 2020-06-13 DIAGNOSIS — E119 Type 2 diabetes mellitus without complications: Secondary | ICD-10-CM | POA: Diagnosis not present

## 2020-06-27 DIAGNOSIS — Z23 Encounter for immunization: Secondary | ICD-10-CM | POA: Diagnosis not present

## 2020-11-06 DIAGNOSIS — Z961 Presence of intraocular lens: Secondary | ICD-10-CM | POA: Diagnosis not present

## 2020-11-06 DIAGNOSIS — H353132 Nonexudative age-related macular degeneration, bilateral, intermediate dry stage: Secondary | ICD-10-CM | POA: Diagnosis not present

## 2020-11-06 DIAGNOSIS — H524 Presbyopia: Secondary | ICD-10-CM | POA: Diagnosis not present

## 2020-11-07 DIAGNOSIS — L578 Other skin changes due to chronic exposure to nonionizing radiation: Secondary | ICD-10-CM | POA: Diagnosis not present

## 2020-11-07 DIAGNOSIS — Z86007 Personal history of in-situ neoplasm of skin: Secondary | ICD-10-CM | POA: Diagnosis not present

## 2020-11-07 DIAGNOSIS — C4441 Basal cell carcinoma of skin of scalp and neck: Secondary | ICD-10-CM | POA: Diagnosis not present

## 2020-11-07 DIAGNOSIS — L821 Other seborrheic keratosis: Secondary | ICD-10-CM | POA: Diagnosis not present

## 2020-11-07 DIAGNOSIS — L570 Actinic keratosis: Secondary | ICD-10-CM | POA: Diagnosis not present

## 2020-11-07 DIAGNOSIS — D2272 Melanocytic nevi of left lower limb, including hip: Secondary | ICD-10-CM | POA: Diagnosis not present

## 2020-11-07 DIAGNOSIS — D1801 Hemangioma of skin and subcutaneous tissue: Secondary | ICD-10-CM | POA: Diagnosis not present

## 2020-11-07 DIAGNOSIS — L814 Other melanin hyperpigmentation: Secondary | ICD-10-CM | POA: Diagnosis not present

## 2020-11-28 DIAGNOSIS — Z23 Encounter for immunization: Secondary | ICD-10-CM | POA: Diagnosis not present

## 2020-12-01 ENCOUNTER — Other Ambulatory Visit: Payer: Self-pay | Admitting: Cardiology

## 2020-12-03 ENCOUNTER — Telehealth: Payer: Self-pay | Admitting: Cardiology

## 2020-12-03 NOTE — Telephone Encounter (Signed)
*  STAT* If patient is at the pharmacy, call can be transferred to refill team.   1. Which medications need to be refilled? (please list name of each medication and dose if known)  lisinopril (ZESTRIL) 10 MG tablet atorvastatin (LIPITOR) 80 MG tablet  2. Which pharmacy/location (including street and city if local pharmacy) is medication to be sent to? [facility] [id number] - [place], [place]  3. Do they need a 30 day or 90 day supply? 90  Patient is going out of town on Tuesday.  He is scheduled to see Dr. Martinique 04/17/21

## 2020-12-12 ENCOUNTER — Ambulatory Visit: Payer: Medicare Other | Admitting: Cardiology

## 2020-12-20 DIAGNOSIS — I10 Essential (primary) hypertension: Secondary | ICD-10-CM | POA: Diagnosis not present

## 2020-12-20 DIAGNOSIS — L739 Follicular disorder, unspecified: Secondary | ICD-10-CM | POA: Diagnosis not present

## 2020-12-20 DIAGNOSIS — E119 Type 2 diabetes mellitus without complications: Secondary | ICD-10-CM | POA: Diagnosis not present

## 2020-12-20 DIAGNOSIS — E7800 Pure hypercholesterolemia, unspecified: Secondary | ICD-10-CM | POA: Diagnosis not present

## 2021-02-27 ENCOUNTER — Other Ambulatory Visit: Payer: Self-pay | Admitting: Cardiology

## 2021-03-19 DIAGNOSIS — L739 Follicular disorder, unspecified: Secondary | ICD-10-CM | POA: Diagnosis not present

## 2021-03-19 DIAGNOSIS — L821 Other seborrheic keratosis: Secondary | ICD-10-CM | POA: Diagnosis not present

## 2021-03-19 DIAGNOSIS — Z23 Encounter for immunization: Secondary | ICD-10-CM | POA: Diagnosis not present

## 2021-03-19 DIAGNOSIS — D692 Other nonthrombocytopenic purpura: Secondary | ICD-10-CM | POA: Diagnosis not present

## 2021-03-19 DIAGNOSIS — L570 Actinic keratosis: Secondary | ICD-10-CM | POA: Diagnosis not present

## 2021-04-09 ENCOUNTER — Encounter: Payer: Self-pay | Admitting: Cardiology

## 2021-04-09 ENCOUNTER — Ambulatory Visit (INDEPENDENT_AMBULATORY_CARE_PROVIDER_SITE_OTHER): Payer: Medicare Other | Admitting: Cardiology

## 2021-04-09 ENCOUNTER — Other Ambulatory Visit: Payer: Self-pay

## 2021-04-09 VITALS — BP 144/76 | HR 66 | Ht 68.5 in | Wt 164.4 lb

## 2021-04-09 DIAGNOSIS — I25810 Atherosclerosis of coronary artery bypass graft(s) without angina pectoris: Secondary | ICD-10-CM

## 2021-04-09 DIAGNOSIS — R079 Chest pain, unspecified: Secondary | ICD-10-CM | POA: Diagnosis not present

## 2021-04-09 DIAGNOSIS — I10 Essential (primary) hypertension: Secondary | ICD-10-CM

## 2021-04-09 DIAGNOSIS — E7800 Pure hypercholesterolemia, unspecified: Secondary | ICD-10-CM | POA: Diagnosis not present

## 2021-04-09 MED ORDER — ATORVASTATIN CALCIUM 80 MG PO TABS: 80.0000 mg | ORAL_TABLET | Freq: Every day | ORAL | 3 refills | Status: DC

## 2021-04-09 MED ORDER — LISINOPRIL 10 MG PO TABS: 10.0000 mg | ORAL_TABLET | Freq: Every day | ORAL | 3 refills | Status: DC

## 2021-04-09 NOTE — Progress Notes (Signed)
Cardiology Office Note   Date:  04/09/2021   ID:  Kevin Wheeler, DOB [birth year], MRN [id number]  PCP:  [name], MD  Cardiologist:   [name], MD   Chief Complaint  Patient presents with   Coronary Artery Disease      History of Present Illness: Kevin Wheeler is a 71 y.o. male who presents for follow up of CAD. He is status post CABG in 1999. His last stress Myoview test in Feb 2017 was low risk.     On follow up today he is doing very well. Denies any  SOB, dizziness, palpitations, edema. He does note some intermittent pain in the left precordium that is fairly mild and not associated with activity. Walking 4-5 miles per day. He no longer drives due to vision problems - macular holes.     Past Medical History:  Diagnosis Date   Arthritis    Cancer ([place])    Skin Cancers- Basal cell 3 arm 1 forehead   Constipation    Coronary artery disease    Diabetes mellitus type 2, noninsulin dependent (HCC)    Diet controlled.   Hiatal hernia    Hypercholesteremia    Hypertension    Macular hole    Plantar fascia rupture     Past Surgical History:  Procedure Laterality Date   ANTERIOR CERVICAL DECOMP/DISCECTOMY FUSION  10/24/2011   Procedure: ANTERIOR CERVICAL DECOMPRESSION/DISCECTOMY FUSION 1 LEVEL;  Surgeon: Floyce Stakes, MD;  Location: [facility];  Service: Neurosurgery;  Laterality: N/A;  Cervical three four  Anterior cervical decompression/diskectomy/fusion/Plate   APPENDECTOMY     CARDIAC CATHETERIZATION     Ejection fraction is estimated at 60%   COLONOSCOPY     CORONARY ARTERY BYPASS GRAFT  February 15, 1998   Included an LIMA graft to the LAD, saphenous vein graft sequentially to the intermediate and obtuse marginal vessels, and a sequential vein graft to the acute marginal and distal circumflex. There is also a vein graft to the diagonal   VASECTOMY       Current Outpatient Medications  Medication Sig Dispense Refill   aspirin 81 MG tablet  Take 81 mg by mouth daily.       betamethasone dipropionate 0.05 % cream Apply topically as needed.     CLINDAMYCIN PHOSPHATE EX Apply topically as needed.     Clobetasol Prop Emollient Base 0.05 % emollient cream Apply topically as needed.     diclofenac (VOLTAREN) 75 MG EC tablet Take 75 mg by mouth 2 (two) times daily as needed.     Multiple Vitamins-Minerals (PRESERVISION AREDS 2 PO) Take 1 capsule by mouth 2 (two) times daily.     atorvastatin (LIPITOR) 80 MG tablet Take 1 tablet (80 mg total) by mouth daily. 90 tablet 3   lisinopril (ZESTRIL) 10 MG tablet Take 1 tablet (10 mg total) by mouth daily. 90 tablet 3   Current Facility-Administered Medications  Medication Dose Route Frequency Provider Last Rate Last Admin   triamcinolone acetonide (KENALOG) 10 MG/ML injection 10 mg  10 mg Other Once Bronson Ing, DPM        Allergies:   Patient has no known allergies.    Social History:  The patient  reports that he has never smoked. He has never used smokeless tobacco. He reports current alcohol use of about 8.0 standard drinks per week. He reports that he does not use drugs.   Family History:  The patient's family history includes  Congestive Heart Failure in his father; Heart disease in his father.    ROS:  Please see the history of present illness.   Otherwise, review of systems are positive for none.   All other systems are reviewed and negative.    PHYSICAL EXAM: VS:  BP (!) 144/76    Pulse 66    Ht 5' 8.5" (1.74 m)    Wt 164 lb 6.4 oz (74.6 kg)    SpO2 100%    BMI 24.63 kg/m  , BMI Body mass index is 24.63 kg/m. GEN: Well nourished, well developed, in no acute distress  HEENT: normal  Neck: no JVD, carotid bruits, or masses Cardiac: RRR; no murmurs, rubs, or gallops,no edema  Respiratory:  clear to auscultation bilaterally, normal work of breathing GI: soft, nontender, nondistended, + BS MS: no deformity or atrophy  Skin: warm and dry, no rash Neuro:  Strength and  sensation are intact Psych: euthymic mood, full affect   EKG:  EKG is not ordered today.     Recent Labs: Lab Results  Component Value Date   WBC 20.6 (H) 10/24/2011   HGB 15.6 10/24/2011   HCT 45.1 10/24/2011   PLT 183 10/24/2011   GLUCOSE 170 (H) 10/24/2011   ALT 16 12/06/2019   AST 18 12/06/2019   NA 139 10/24/2011   K 3.7 10/24/2011   CL 99 10/24/2011   CREATININE 1.04 10/24/2011   BUN 29 (H) 10/24/2011   CO2 27 10/24/2011    No results found for requested labs within last 8760 hours.    Lipid Panel No results found for: CHOL, TRIG, HDL, CHOLHDL, VLDL, LDLCALC, LDLDIRECT   Dated 06/03/19: cholesterol 113, triglycerides 54, HDL 47, LDL 54.  A1c 6.1%. glucose 128. CMET otherwise normal. Dated 12/20/20: cholesterol 135, triglycerides 60, HDL 40, LDL 82. A1c 5.7%. CMET normal.   Wt Readings from Last 3 Encounters:  04/09/21 164 lb 6.4 oz (74.6 kg)  11/02/19 165 lb (74.8 kg)  10/21/18 161 lb (73 kg)      Other studies Reviewed: Additional studies/ records that were reviewed today include:   Myoview 04/05/15: Study Highlights    Nuclear stress EF: 54%. Horizontal ST segment depression ST segment depression of 0.8 mm was noted during stress in the II, III, aVF, V6 and V5 leads. Defect 1: There is a small defect of mild severity present in the apical anterior and apex location. Findings consistent with ischemia. This is a low risk study.   Low risk stress nuclear study with subtle reversible ischemia in the distal LAD artery distribution and normal left ventricular regional and global systolic function     ASSESSMENT AND PLAN:  1. Coronary disease status post CABG in 1999. Hehas mild atypical chest pain. Last Myoview study in Feb 2017 was low risk. Will update Myoview study now.  Recommend continued aspirin and statin therapy.    2. Hypertension well controlled.   3. Hyperlipidemia-excellent  control on lipitor. Last LDL increased to 82. Goal < 70. Monitor  diet.    Current medicines are reviewed at length with the patient today.  The patient does not have concerns regarding medicines.  The following changes have been made:  no change  Labs/ tests ordered today include:   Orders Placed This Encounter  Procedures   Cardiac Stress Test: Informed Consent Details: Physician/Practitioner Attestation; Transcribe to consent form and obtain patient signature     Disposition:   FU with me in 1 year  Signed, Isack Lavalley Martinique,  MD  04/09/2021 9:19 AM    [facility] [address] [place], [place], [address] Phone [phone], Fax [phone]

## 2021-04-09 NOTE — Patient Instructions (Addendum)
We will arrange for you to have a nuclear stress test stress myoview   Follow up in 1 year   Call in Oct to schedule Feb appointment

## 2021-04-17 ENCOUNTER — Ambulatory Visit: Payer: Medicare Other | Admitting: Cardiology

## 2021-04-19 ENCOUNTER — Telehealth (HOSPITAL_COMMUNITY): Payer: Self-pay | Admitting: *Deleted

## 2021-04-19 NOTE — Telephone Encounter (Signed)
Close encounter 

## 2021-04-24 ENCOUNTER — Ambulatory Visit (HOSPITAL_COMMUNITY)
Admission: RE | Admit: 2021-04-24 | Discharge: 2021-04-24 | Disposition: A | Discharge status: Home / Self Care | Payer: Medicare Other | Source: Ambulatory Visit | Attending: Cardiovascular Disease | Admitting: Cardiovascular Disease

## 2021-04-24 ENCOUNTER — Other Ambulatory Visit: Payer: Self-pay

## 2021-04-24 DIAGNOSIS — I10 Essential (primary) hypertension: Secondary | ICD-10-CM | POA: Insufficient documentation

## 2021-04-24 DIAGNOSIS — R079 Chest pain, unspecified: Secondary | ICD-10-CM | POA: Insufficient documentation

## 2021-04-24 DIAGNOSIS — E7800 Pure hypercholesterolemia, unspecified: Secondary | ICD-10-CM | POA: Insufficient documentation

## 2021-04-24 DIAGNOSIS — I25810 Atherosclerosis of coronary artery bypass graft(s) without angina pectoris: Secondary | ICD-10-CM | POA: Insufficient documentation

## 2021-04-24 LAB — MYOCARDIAL PERFUSION IMAGING
Angina Index: 0
Estimated workload: 10.9
Exercise duration (min): 9 min
Exercise duration (sec): 31 s
LV dias vol: 117 mL (ref 62–150)
LV sys vol: 56 mL
MPHR: 148 {beats}/min
Nuc Stress EF: 53 %
Peak HR: 137 {beats}/min
Percent HR: 92 %
Rest HR: 61 {beats}/min
Rest Nuclear Isotope Dose: 11 mCi
SDS: 2
SRS: 1
SSS: 3
Stress Nuclear Isotope Dose: 31.2 mCi
TID: 1.04

## 2021-04-24 MED ORDER — TECHNETIUM TC 99M TETROFOSMIN IV KIT
11.0000 | PACK | Freq: Once | INTRAVENOUS | Status: AC | PRN
  Administered 2021-04-24: 11 via INTRAVENOUS
  Filled 2021-04-24: qty 11

## 2021-04-24 MED ORDER — TECHNETIUM TC 99M TETROFOSMIN IV KIT
31.2000 | PACK | Freq: Once | INTRAVENOUS | Status: AC | PRN
  Administered 2021-04-24: 31.2 via INTRAVENOUS
  Filled 2021-04-24: qty 32

## 2021-07-19 DIAGNOSIS — E119 Type 2 diabetes mellitus without complications: Secondary | ICD-10-CM | POA: Diagnosis not present

## 2021-07-19 DIAGNOSIS — D692 Other nonthrombocytopenic purpura: Secondary | ICD-10-CM | POA: Diagnosis not present

## 2021-07-19 DIAGNOSIS — E7800 Pure hypercholesterolemia, unspecified: Secondary | ICD-10-CM | POA: Diagnosis not present

## 2021-07-19 DIAGNOSIS — I10 Essential (primary) hypertension: Secondary | ICD-10-CM | POA: Diagnosis not present

## 2021-07-19 DIAGNOSIS — M545 Low back pain, unspecified: Secondary | ICD-10-CM | POA: Diagnosis not present

## 2021-07-19 DIAGNOSIS — Z0000 Encounter for general adult medical examination without abnormal findings: Secondary | ICD-10-CM | POA: Diagnosis not present

## 2021-09-11 ENCOUNTER — Encounter: Payer: Self-pay | Admitting: Podiatrist

## 2021-09-11 ENCOUNTER — Telehealth: Payer: Self-pay | Admitting: Cardiology

## 2021-09-11 ENCOUNTER — Ambulatory Visit (INDEPENDENT_AMBULATORY_CARE_PROVIDER_SITE_OTHER): Payer: Medicare Other | Admitting: Podiatrist

## 2021-09-11 DIAGNOSIS — M79609 Pain in unspecified limb: Secondary | ICD-10-CM | POA: Diagnosis not present

## 2021-09-11 DIAGNOSIS — B351 Tinea unguium: Secondary | ICD-10-CM | POA: Diagnosis not present

## 2021-09-11 DIAGNOSIS — I25810 Atherosclerosis of coronary artery bypass graft(s) without angina pectoris: Secondary | ICD-10-CM

## 2021-09-11 MED ORDER — ITRACONAZOLE 100 MG PO CAPS: 100.0000 mg | ORAL_CAPSULE | Freq: Two times a day (BID) | ORAL | 2 refills | Status: DC

## 2021-09-11 NOTE — Progress Notes (Signed)
Chief Complaint  Patient presents with   Nail Problem    BILATERAL NAIL FUNGUS, AND NAIL TRIM       HPI: Patient is 71 y.o. male who presents today for toenail fungus especially on the great toenails bilateral. He has been on lamisil in the past and relates some improvement in the appearance of the toenails.  At this time however he relates the nails continue to be thickened, yellow, brittle and appear fungal. They are painful in shoes.   Patient Active Problem List   Diagnosis Date Noted   Plantar fasciitis 01/05/2017   Macular pattern dystrophy 12/27/2013   Nuclear cataract of both eyes 12/27/2013   Coronary artery disease    Hypertension    Hypercholesteremia    Diabetes mellitus type 2, noninsulin dependent ([place])     Current Outpatient Medications on File Prior to Visit  Medication Sig Dispense Refill   aspirin 81 MG tablet Take 81 mg by mouth daily.       atorvastatin (LIPITOR) 80 MG tablet Take 1 tablet (80 mg total) by mouth daily. 90 tablet 3   betamethasone dipropionate 0.05 % cream Apply topically as needed.     CLINDAMYCIN PHOSPHATE EX Apply topically as needed.     Clobetasol Prop Emollient Base 0.05 % emollient cream Apply topically as needed.     diclofenac (VOLTAREN) 75 MG EC tablet Take 75 mg by mouth 2 (two) times daily as needed.     lisinopril (ZESTRIL) 10 MG tablet Take 1 tablet (10 mg total) by mouth daily. 90 tablet 3   Multiple Vitamins-Minerals (PRESERVISION AREDS 2 PO) Take 1 capsule by mouth 2 (two) times daily.     No current facility-administered medications on file prior to visit.    No Known Allergies  Review of Systems No fevers, chills, nausea, muscle aches, no difficulty breathing, no calf pain, no chest pain or shortness of breath.   Physical Exam  GENERAL APPEARANCE: Alert, conversant. Appropriately groomed. No acute distress.   VASCULAR: Pedal pulses are palpable and strong.  Digital hair growth is noted.  Capillary refill time is  immediate.  Normal proximal or distal cooling is noted.  NEUROLOGIC: sensation is intact to 5.07 monofilament at 5/5 sites bilateral.  Light touch is intact bilateral, vibratory sensation intact bilateral  MUSCULOSKELETAL: acceptable muscle strength, tone and stability bilateral.  No gross boney pedal deformities noted.    DERMATOLOGIC: skin is warm, supple, and dry.  Right hallux nail is thickened, discolored and does appear bruised.  It is separated from the underlying nail plate.  The left hallux nail is also thickened, discolored, dystrophic and does appear clinically mycotic.  All digital nails are also elongated, thickened and uncomfortable with pressure x10.   Assessment    ICD-10-CM   1. Pain due to onychomycosis of nail  B35.1    M79.609        Plan At today's visit we discussed treatment options and alternatives.  Today I debrided the nails with sterile instrumentation manually and mechanically.  Discussed that the right hallux nail is loose and if it becomes painful or symptomatic he should call and we can remove it for him in the future.  We also discussed treatment of the toenails.  He has already been on Lamisil and did very well with this medication however the fungus did not clear.  Discussed that we can try Sporanox for him and he would like to consider this medication.  He relates his recent liver function  have been normal.  We did discuss Sporanox I will call this into his pharmacy for him.  Also discussed laser therapy as a possible adjunct treatment.  If he is interested in this he will call the [facility] office to set up 3-4 laser treatments at his convenience.  If any questions or concerns arise he will call otherwise he will be seen back for recheck.

## 2021-09-11 NOTE — Telephone Encounter (Signed)
Patient is concern that the medication he was prescribed for foot fungus - Itraconazole '100mg'$  capsule, to be taken 2x day may be conflict with atorvastatin (LIPITOR) 80 MG tablet (Expired).

## 2021-09-11 NOTE — Telephone Encounter (Addendum)
Routed to pharmD to review.   Left message to make patient aware.

## 2021-09-12 NOTE — Telephone Encounter (Signed)
Itraconazole can increase concentrations of atorvastatin therefore increasing risk of side effects such as muscle pain.  If patient is prone to muscle pain while on statins, recommend he take 1/2 dose of his atorvastatin while on itraconazole treatment.

## 2021-09-12 NOTE — Telephone Encounter (Signed)
Called patient left message on personal voice mail to call back. 

## 2021-09-13 NOTE — Telephone Encounter (Signed)
LMTCB

## 2021-09-13 NOTE — Telephone Encounter (Signed)
Pt is returning call.  

## 2021-09-16 NOTE — Telephone Encounter (Signed)
I called patient, LVM to return call to discuss medication concern.   Left call back number.

## 2021-09-17 NOTE — Telephone Encounter (Signed)
LMTCB to discuss medications. 

## 2021-09-19 NOTE — Telephone Encounter (Signed)
Called patient left message on personal voice mail to call back. 

## 2021-09-19 NOTE — Telephone Encounter (Signed)
  Pt is returning call, he said he never got the voicemail. We verified the phone number 3x he said its correct. He said, if the nurse cannot reach him to send him a mychart message instead

## 2021-09-20 ENCOUNTER — Encounter: Payer: Self-pay | Admitting: *Deleted

## 2021-09-20 NOTE — Telephone Encounter (Signed)
Pharm md comments sent to patient via my chart

## 2021-11-06 DIAGNOSIS — H524 Presbyopia: Secondary | ICD-10-CM | POA: Diagnosis not present

## 2021-11-06 DIAGNOSIS — H353133 Nonexudative age-related macular degeneration, bilateral, advanced atrophic without subfoveal involvement: Secondary | ICD-10-CM | POA: Diagnosis not present

## 2021-11-06 DIAGNOSIS — E119 Type 2 diabetes mellitus without complications: Secondary | ICD-10-CM | POA: Diagnosis not present

## 2021-11-06 DIAGNOSIS — Z961 Presence of intraocular lens: Secondary | ICD-10-CM | POA: Diagnosis not present

## 2021-11-13 DIAGNOSIS — L570 Actinic keratosis: Secondary | ICD-10-CM | POA: Diagnosis not present

## 2021-11-13 DIAGNOSIS — L578 Other skin changes due to chronic exposure to nonionizing radiation: Secondary | ICD-10-CM | POA: Diagnosis not present

## 2021-11-13 DIAGNOSIS — L739 Follicular disorder, unspecified: Secondary | ICD-10-CM | POA: Diagnosis not present

## 2021-11-13 DIAGNOSIS — D229 Melanocytic nevi, unspecified: Secondary | ICD-10-CM | POA: Diagnosis not present

## 2021-11-13 DIAGNOSIS — L814 Other melanin hyperpigmentation: Secondary | ICD-10-CM | POA: Diagnosis not present

## 2021-11-13 DIAGNOSIS — Z86007 Personal history of in-situ neoplasm of skin: Secondary | ICD-10-CM | POA: Diagnosis not present

## 2021-11-13 DIAGNOSIS — L821 Other seborrheic keratosis: Secondary | ICD-10-CM | POA: Diagnosis not present

## 2021-11-20 DIAGNOSIS — H35033 Hypertensive retinopathy, bilateral: Secondary | ICD-10-CM | POA: Diagnosis not present

## 2021-11-20 DIAGNOSIS — H3553 Other dystrophies primarily involving the sensory retina: Secondary | ICD-10-CM | POA: Diagnosis not present

## 2021-12-06 ENCOUNTER — Other Ambulatory Visit: Payer: Self-pay | Admitting: Podiatrist

## 2021-12-06 NOTE — Telephone Encounter (Signed)
Please advise 

## 2021-12-18 DIAGNOSIS — Z83719 Family history of colon polyps, unspecified: Secondary | ICD-10-CM | POA: Diagnosis not present

## 2021-12-18 DIAGNOSIS — K5730 Diverticulosis of large intestine without perforation or abscess without bleeding: Secondary | ICD-10-CM | POA: Diagnosis not present

## 2021-12-18 DIAGNOSIS — K648 Other hemorrhoids: Secondary | ICD-10-CM | POA: Diagnosis not present

## 2021-12-18 DIAGNOSIS — Z1211 Encounter for screening for malignant neoplasm of colon: Secondary | ICD-10-CM | POA: Diagnosis not present

## 2022-01-08 DIAGNOSIS — Z23 Encounter for immunization: Secondary | ICD-10-CM | POA: Diagnosis not present

## 2022-01-30 DIAGNOSIS — L739 Follicular disorder, unspecified: Secondary | ICD-10-CM | POA: Diagnosis not present

## 2022-01-30 DIAGNOSIS — E7800 Pure hypercholesterolemia, unspecified: Secondary | ICD-10-CM | POA: Diagnosis not present

## 2022-01-30 DIAGNOSIS — E119 Type 2 diabetes mellitus without complications: Secondary | ICD-10-CM | POA: Diagnosis not present

## 2022-01-30 DIAGNOSIS — I10 Essential (primary) hypertension: Secondary | ICD-10-CM | POA: Diagnosis not present

## 2022-01-30 DIAGNOSIS — M722 Plantar fascial fibromatosis: Secondary | ICD-10-CM | POA: Diagnosis not present

## 2022-01-31 ENCOUNTER — Ambulatory Visit (INDEPENDENT_AMBULATORY_CARE_PROVIDER_SITE_OTHER): Payer: Medicare Other | Admitting: Podiatry

## 2022-01-31 ENCOUNTER — Ambulatory Visit (INDEPENDENT_AMBULATORY_CARE_PROVIDER_SITE_OTHER): Payer: Medicare Other

## 2022-01-31 DIAGNOSIS — M722 Plantar fascial fibromatosis: Secondary | ICD-10-CM

## 2022-01-31 MED ORDER — TRIAMCINOLONE ACETONIDE 10 MG/ML IJ SUSP
10.0000 mg | Freq: Once | INTRAMUSCULAR | Status: AC
  Administered 2022-01-31: 10 mg

## 2022-02-02 NOTE — Progress Notes (Signed)
Subjective:   Patient ID: Kevin Wheeler, male   DOB: 71 y.o.   MRN: [id number]   HPI Patient states that he felt a pop in his heel 4 days ago when walking and it has been very sore and hard to walk down on   ROS      Objective:  Physical Exam  Neurovascular status intact with patient found to have inflammation and pain in the left plantar fascia but at the insertion and when I evaluated thoroughly it appears the tendon is intact     Assessment:  Appears to be more of an inflammatory fasciitis versus a tear of the plantar fascia at this time       Plan:  8 H&P x-ray reviewed discussed boot usage that he has at home and since its mostly in the plantar heel I did go ahead today I did sterile prep I injected the plantar fascia 3 mg Kenalog 5 mg Xylocaine and I went ahead and I advised him on his boot reappoint for [place] to recheck if symptoms persist  X-rays indicate no signs of bony injury associated with condition minimal spur no indication of stress fracture

## 2022-02-03 ENCOUNTER — Ambulatory Visit: Payer: Medicare Other | Admitting: Podiatry

## 2022-06-03 ENCOUNTER — Other Ambulatory Visit: Payer: Self-pay | Admitting: Cardiology

## 2022-06-17 NOTE — Progress Notes (Unsigned)
   Cardiology Clinic Note   Date: 06/18/2022 ID: TEGH FRANEK, DOB 1950-08-06, MRN [id number]  Primary Cardiologist:  Peter Swaziland, MD  Patient Profile    Kevin Wheeler is a 72 y.o. male who presents to the clinic today for routine follow-up.   Past medical history significant for: CAD. CABG x 6 02/15/1998: LIMA to LAD.  Sequential SVG to intermediate and OM.  Sequential SVG to acute marginal and distal LCx.  SVG to diagonal. Nuclear stress test 04/24/2021: Normal, low risk study. Hypertension. Hyperlipidemia. Lipid panel 01/31/2022: LDL 61, HDL 44, TG 78, total 121. T2DM.   History of Present Illness    Kevin Wheeler is a longtime patient of Dr. Swaziland followed for the above outlined history.  Patient was last seen in the office by Dr. Swaziland on 04/09/2021 with complaints of atypical chest pain.  He underwent nuclear stress test which was a normal low risk study.  He otherwise was doing well and no medication changes were made.  Today, patient is doing well. Patient denies shortness of breath or dyspnea on exertion. No chest pain, pressure, or tightness. Denies lower extremity edema, orthopnea, or PND. No palpitations. 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 He is very active walking with his wife. During covid they were walking 4-5 miles daily. He developed plantar fasciitis and reduced the distance to about 3 miles daily. Unfortunately, his wife was diagnosed with breast cancer at the end of the year and underwent bilateral mastectomy. She will not need chemo/radiation. They continue to walk a couple of miles a day.     ROS: All other systems reviewed and are otherwise negative except as noted in History of Present Illness.  Studies Reviewed    ECG personally reviewed by me today: NSR, 69 bpm.  No significant changes from 11/02/2019.           Physical Exam    VS:  BP (!) 120/56 (BP Location: Left Arm, Patient Position: Sitting, Cuff Size: Small)   Pulse  69   Ht  (1.727 m)   Wt 163 lb (73.9 kg)   SpO2 99%   BMI 24.78 kg/m  , BMI Body mass index is 24.78 kg/m.  GEN: Well nourished, well developed, in no acute distress. Neck: No JVD or carotid bruits. Cardiac: RRR. No murmurs. No rubs or gallops.   Respiratory:  Respirations regular and unlabored. Clear to auscultation without rales, wheezing or rhonchi. GI: Soft, nontender, nondistended. Extremities: Radials/DP/PT 2+ and equal bilaterally. No clubbing or cyanosis. No edema.  Skin: Warm and dry, no rash. Neuro: Strength intact.  Assessment & Plan    CAD.  CABG x 29 January 1998.  Normal nuclear stress test March 2023.  Patient denies chest pain, pressure or tightness. Stays very active with daily walks.  Continue aspirin, atorvastatin. Hypertension.  BP today 120/56. Patient denies headaches or dizziness.  Continue lisinopril. Hyperlipidemia.  LDL December 2023 61, at goal.  Continue atorvastatin.  Disposition: Return in 1 year or sooner as needed.         Signed, Etta Grandchild. Shaquisha Wynn, DNP, NP-C

## 2022-06-18 ENCOUNTER — Encounter: Payer: Self-pay | Admitting: Student

## 2022-06-18 ENCOUNTER — Ambulatory Visit: Payer: Medicare Other | Attending: Student | Admitting: Student

## 2022-06-18 VITALS — BP 120/56 | HR 69 | Ht 68.0 in | Wt 163.0 lb

## 2022-06-18 DIAGNOSIS — I10 Essential (primary) hypertension: Secondary | ICD-10-CM | POA: Insufficient documentation

## 2022-06-18 DIAGNOSIS — E785 Hyperlipidemia, unspecified: Secondary | ICD-10-CM | POA: Insufficient documentation

## 2022-06-18 DIAGNOSIS — I25810 Atherosclerosis of coronary artery bypass graft(s) without angina pectoris: Secondary | ICD-10-CM | POA: Diagnosis not present

## 2022-06-18 MED ORDER — LISINOPRIL 10 MG PO TABS: 10.0000 mg | ORAL_TABLET | Freq: Every day | ORAL | 3 refills | Status: DC

## 2022-06-18 MED ORDER — ATORVASTATIN CALCIUM 80 MG PO TABS: 80.0000 mg | ORAL_TABLET | Freq: Every day | ORAL | 3 refills | Status: DC

## 2022-06-18 NOTE — Patient Instructions (Signed)
Medication Instructions:  Your physician recommends that you continue on your current medications as directed. Please refer to the Current Medication list given to you today.  *If you need a refill on your cardiac medications before your next appointment, please call your pharmacy*   Lab Work: NONE If you have labs (blood work) drawn today and your tests are completely normal, you will receive your results only by: MyChart Message (if you have MyChart) OR A paper copy in the mail If you have any lab test that is abnormal or we need to change your treatment, we will call you to review the results.   Testing/Procedures: NONE   Follow-Up: At [facility], you and your health needs are our priority.  As part of our continuing mission to provide you with exceptional heart care, we have created designated Provider Care Teams.  These Care Teams include your primary Cardiologist (physician) and Advanced Practice Providers (APPs -  Physician Assistants and Nurse Practitioners) who all work together to provide you with the care you need, when you need it.  We recommend signing up for the patient portal called "MyChart".  Sign up information is provided on this After Visit Summary.  MyChart is used to connect with patients for Virtual Visits (Telemedicine).  Patients are able to view lab/test results, encounter notes, upcoming appointments, etc.  Non-urgent messages can be sent to your provider as well.   To learn more about what you can do with MyChart, go to [email or web address].    Your next appointment:   1 year(s)  Provider:   Peter Jordan, MD    

## 2022-08-05 DIAGNOSIS — I10 Essential (primary) hypertension: Secondary | ICD-10-CM | POA: Diagnosis not present

## 2022-08-05 DIAGNOSIS — L739 Follicular disorder, unspecified: Secondary | ICD-10-CM | POA: Diagnosis not present

## 2022-08-05 DIAGNOSIS — Z23 Encounter for immunization: Secondary | ICD-10-CM | POA: Diagnosis not present

## 2022-08-05 DIAGNOSIS — E7800 Pure hypercholesterolemia, unspecified: Secondary | ICD-10-CM | POA: Diagnosis not present

## 2022-08-05 DIAGNOSIS — E119 Type 2 diabetes mellitus without complications: Secondary | ICD-10-CM | POA: Diagnosis not present

## 2022-10-01 DIAGNOSIS — M5116 Intervertebral disc disorders with radiculopathy, lumbar region: Secondary | ICD-10-CM | POA: Diagnosis not present

## 2022-10-13 DIAGNOSIS — R569 Unspecified convulsions: Secondary | ICD-10-CM | POA: Diagnosis not present

## 2022-10-13 DIAGNOSIS — S199XXA Unspecified injury of neck, initial encounter: Secondary | ICD-10-CM | POA: Diagnosis not present

## 2022-10-13 DIAGNOSIS — R579 Shock, unspecified: Secondary | ICD-10-CM | POA: Diagnosis not present

## 2022-10-13 DIAGNOSIS — G40401 Other generalized epilepsy and epileptic syndromes, not intractable, with status epilepticus: Secondary | ICD-10-CM | POA: Diagnosis present

## 2022-10-13 DIAGNOSIS — R739 Hyperglycemia, unspecified: Secondary | ICD-10-CM | POA: Diagnosis not present

## 2022-10-13 DIAGNOSIS — Z8673 Personal history of transient ischemic attack (TIA), and cerebral infarction without residual deficits: Secondary | ICD-10-CM | POA: Diagnosis not present

## 2022-10-13 DIAGNOSIS — Z4682 Encounter for fitting and adjustment of non-vascular catheter: Secondary | ICD-10-CM | POA: Diagnosis not present

## 2022-10-13 DIAGNOSIS — G40901 Epilepsy, unspecified, not intractable, with status epilepticus: Secondary | ICD-10-CM | POA: Diagnosis not present

## 2022-10-13 DIAGNOSIS — R7989 Other specified abnormal findings of blood chemistry: Secondary | ICD-10-CM | POA: Diagnosis not present

## 2022-10-13 DIAGNOSIS — N270 Small kidney, unilateral: Secondary | ICD-10-CM | POA: Diagnosis not present

## 2022-10-13 DIAGNOSIS — R0603 Acute respiratory distress: Secondary | ICD-10-CM | POA: Diagnosis not present

## 2022-10-13 DIAGNOSIS — Z452 Encounter for adjustment and management of vascular access device: Secondary | ICD-10-CM | POA: Diagnosis not present

## 2022-10-13 DIAGNOSIS — I498 Other specified cardiac arrhythmias: Secondary | ICD-10-CM | POA: Diagnosis not present

## 2022-10-13 DIAGNOSIS — E46 Unspecified protein-calorie malnutrition: Secondary | ICD-10-CM | POA: Diagnosis present

## 2022-10-13 DIAGNOSIS — J90 Pleural effusion, not elsewhere classified: Secondary | ICD-10-CM | POA: Diagnosis not present

## 2022-10-13 DIAGNOSIS — D72829 Elevated white blood cell count, unspecified: Secondary | ICD-10-CM | POA: Diagnosis not present

## 2022-10-13 DIAGNOSIS — I25810 Atherosclerosis of coronary artery bypass graft(s) without angina pectoris: Secondary | ICD-10-CM | POA: Diagnosis not present

## 2022-10-13 DIAGNOSIS — I499 Cardiac arrhythmia, unspecified: Secondary | ICD-10-CM | POA: Diagnosis not present

## 2022-10-13 DIAGNOSIS — I469 Cardiac arrest, cause unspecified: Secondary | ICD-10-CM | POA: Diagnosis not present

## 2022-10-13 DIAGNOSIS — Z981 Arthrodesis status: Secondary | ICD-10-CM | POA: Diagnosis not present

## 2022-10-13 DIAGNOSIS — J9600 Acute respiratory failure, unspecified whether with hypoxia or hypercapnia: Secondary | ICD-10-CM | POA: Diagnosis not present

## 2022-10-13 DIAGNOSIS — Z515 Encounter for palliative care: Secondary | ICD-10-CM | POA: Diagnosis not present

## 2022-10-13 DIAGNOSIS — S2243XA Multiple fractures of ribs, bilateral, initial encounter for closed fracture: Secondary | ICD-10-CM | POA: Diagnosis not present

## 2022-10-13 DIAGNOSIS — K7200 Acute and subacute hepatic failure without coma: Secondary | ICD-10-CM | POA: Diagnosis not present

## 2022-10-13 DIAGNOSIS — S3993XA Unspecified injury of pelvis, initial encounter: Secondary | ICD-10-CM | POA: Diagnosis not present

## 2022-10-13 DIAGNOSIS — Z955 Presence of coronary angioplasty implant and graft: Secondary | ICD-10-CM | POA: Diagnosis not present

## 2022-10-13 DIAGNOSIS — N179 Acute kidney failure, unspecified: Secondary | ICD-10-CM | POA: Diagnosis not present

## 2022-10-13 DIAGNOSIS — X58XXXA Exposure to other specified factors, initial encounter: Secondary | ICD-10-CM | POA: Diagnosis not present

## 2022-10-13 DIAGNOSIS — I4901 Ventricular fibrillation: Secondary | ICD-10-CM | POA: Diagnosis not present

## 2022-10-13 DIAGNOSIS — I214 Non-ST elevation (NSTEMI) myocardial infarction: Secondary | ICD-10-CM | POA: Diagnosis not present

## 2022-10-13 DIAGNOSIS — S0990XA Unspecified injury of head, initial encounter: Secondary | ICD-10-CM | POA: Diagnosis not present

## 2022-10-13 DIAGNOSIS — R570 Cardiogenic shock: Secondary | ICD-10-CM | POA: Diagnosis not present

## 2022-10-13 DIAGNOSIS — J9601 Acute respiratory failure with hypoxia: Secondary | ICD-10-CM | POA: Diagnosis not present

## 2022-10-13 DIAGNOSIS — R001 Bradycardia, unspecified: Secondary | ICD-10-CM | POA: Diagnosis not present

## 2022-10-13 DIAGNOSIS — N390 Urinary tract infection, site not specified: Secondary | ICD-10-CM | POA: Diagnosis not present

## 2022-10-13 DIAGNOSIS — E872 Acidosis, unspecified: Secondary | ICD-10-CM | POA: Diagnosis present

## 2022-10-13 DIAGNOSIS — I10 Essential (primary) hypertension: Secondary | ICD-10-CM | POA: Diagnosis not present

## 2022-10-13 DIAGNOSIS — I472 Ventricular tachycardia, unspecified: Secondary | ICD-10-CM | POA: Diagnosis present

## 2022-10-13 DIAGNOSIS — S3991XA Unspecified injury of abdomen, initial encounter: Secondary | ICD-10-CM | POA: Diagnosis not present

## 2022-10-13 DIAGNOSIS — J690 Pneumonitis due to inhalation of food and vomit: Secondary | ICD-10-CM | POA: Diagnosis not present

## 2022-10-13 DIAGNOSIS — Z66 Do not resuscitate: Secondary | ICD-10-CM | POA: Diagnosis not present

## 2022-10-13 DIAGNOSIS — Z951 Presence of aortocoronary bypass graft: Secondary | ICD-10-CM | POA: Diagnosis not present

## 2022-10-13 DIAGNOSIS — G931 Anoxic brain damage, not elsewhere classified: Secondary | ICD-10-CM | POA: Diagnosis present

## 2022-10-13 DIAGNOSIS — Z1152 Encounter for screening for COVID-19: Secondary | ICD-10-CM | POA: Diagnosis not present

## 2022-10-13 DIAGNOSIS — I4891 Unspecified atrial fibrillation: Secondary | ICD-10-CM | POA: Diagnosis not present

## 2022-10-13 DIAGNOSIS — R791 Abnormal coagulation profile: Secondary | ICD-10-CM | POA: Diagnosis not present

## 2022-10-13 DIAGNOSIS — N170 Acute kidney failure with tubular necrosis: Secondary | ICD-10-CM | POA: Diagnosis not present

## 2022-10-13 DIAGNOSIS — W19XXXA Unspecified fall, initial encounter: Secondary | ICD-10-CM | POA: Diagnosis not present

## 2022-10-13 DIAGNOSIS — G40B11 Juvenile myoclonic epilepsy, intractable, with status epilepticus: Secondary | ICD-10-CM | POA: Diagnosis not present

## 2022-10-13 DIAGNOSIS — I2510 Atherosclerotic heart disease of native coronary artery without angina pectoris: Secondary | ICD-10-CM | POA: Diagnosis present

## 2022-10-13 DIAGNOSIS — I447 Left bundle-branch block, unspecified: Secondary | ICD-10-CM | POA: Diagnosis not present

## 2022-10-13 DIAGNOSIS — G9340 Encephalopathy, unspecified: Secondary | ICD-10-CM | POA: Diagnosis not present

## 2022-10-13 DIAGNOSIS — G253 Myoclonus: Secondary | ICD-10-CM | POA: Diagnosis not present

## 2022-10-14 DIAGNOSIS — R579 Shock, unspecified: Secondary | ICD-10-CM | POA: Diagnosis not present

## 2022-10-14 DIAGNOSIS — R569 Unspecified convulsions: Secondary | ICD-10-CM | POA: Diagnosis not present

## 2022-10-14 DIAGNOSIS — G253 Myoclonus: Secondary | ICD-10-CM | POA: Diagnosis not present

## 2022-10-14 DIAGNOSIS — R7989 Other specified abnormal findings of blood chemistry: Secondary | ICD-10-CM | POA: Diagnosis not present

## 2022-10-14 DIAGNOSIS — Z452 Encounter for adjustment and management of vascular access device: Secondary | ICD-10-CM | POA: Diagnosis not present

## 2022-10-14 DIAGNOSIS — Z4682 Encounter for fitting and adjustment of non-vascular catheter: Secondary | ICD-10-CM | POA: Diagnosis not present

## 2022-10-14 DIAGNOSIS — N390 Urinary tract infection, site not specified: Secondary | ICD-10-CM | POA: Diagnosis not present

## 2022-10-14 DIAGNOSIS — I4891 Unspecified atrial fibrillation: Secondary | ICD-10-CM | POA: Diagnosis not present

## 2022-10-14 DIAGNOSIS — I469 Cardiac arrest, cause unspecified: Secondary | ICD-10-CM | POA: Diagnosis not present

## 2022-10-14 DIAGNOSIS — N179 Acute kidney failure, unspecified: Secondary | ICD-10-CM | POA: Diagnosis not present

## 2022-10-14 DIAGNOSIS — S2243XA Multiple fractures of ribs, bilateral, initial encounter for closed fracture: Secondary | ICD-10-CM | POA: Diagnosis not present

## 2022-10-14 DIAGNOSIS — G9340 Encephalopathy, unspecified: Secondary | ICD-10-CM | POA: Diagnosis not present

## 2022-10-14 DIAGNOSIS — J9600 Acute respiratory failure, unspecified whether with hypoxia or hypercapnia: Secondary | ICD-10-CM | POA: Diagnosis not present

## 2022-10-14 DIAGNOSIS — I498 Other specified cardiac arrhythmias: Secondary | ICD-10-CM | POA: Diagnosis not present

## 2022-10-14 DIAGNOSIS — E872 Acidosis, unspecified: Secondary | ICD-10-CM | POA: Diagnosis not present

## 2022-10-14 DIAGNOSIS — I499 Cardiac arrhythmia, unspecified: Secondary | ICD-10-CM | POA: Diagnosis not present

## 2022-10-14 DIAGNOSIS — D72829 Elevated white blood cell count, unspecified: Secondary | ICD-10-CM | POA: Diagnosis not present

## 2022-10-15 DIAGNOSIS — E46 Unspecified protein-calorie malnutrition: Secondary | ICD-10-CM | POA: Diagnosis not present

## 2022-10-15 DIAGNOSIS — D72829 Elevated white blood cell count, unspecified: Secondary | ICD-10-CM | POA: Diagnosis not present

## 2022-10-15 DIAGNOSIS — R791 Abnormal coagulation profile: Secondary | ICD-10-CM | POA: Diagnosis not present

## 2022-10-15 DIAGNOSIS — N179 Acute kidney failure, unspecified: Secondary | ICD-10-CM | POA: Diagnosis not present

## 2022-10-15 DIAGNOSIS — S2243XA Multiple fractures of ribs, bilateral, initial encounter for closed fracture: Secondary | ICD-10-CM | POA: Diagnosis not present

## 2022-10-15 DIAGNOSIS — J9600 Acute respiratory failure, unspecified whether with hypoxia or hypercapnia: Secondary | ICD-10-CM | POA: Diagnosis not present

## 2022-10-15 DIAGNOSIS — Z951 Presence of aortocoronary bypass graft: Secondary | ICD-10-CM | POA: Diagnosis not present

## 2022-10-15 DIAGNOSIS — I469 Cardiac arrest, cause unspecified: Secondary | ICD-10-CM | POA: Diagnosis not present

## 2022-10-15 DIAGNOSIS — J90 Pleural effusion, not elsewhere classified: Secondary | ICD-10-CM | POA: Diagnosis not present

## 2022-10-15 DIAGNOSIS — G40901 Epilepsy, unspecified, not intractable, with status epilepticus: Secondary | ICD-10-CM | POA: Diagnosis not present

## 2022-10-15 DIAGNOSIS — X58XXXA Exposure to other specified factors, initial encounter: Secondary | ICD-10-CM | POA: Diagnosis not present

## 2022-10-15 DIAGNOSIS — G931 Anoxic brain damage, not elsewhere classified: Secondary | ICD-10-CM | POA: Diagnosis not present

## 2022-10-15 DIAGNOSIS — R569 Unspecified convulsions: Secondary | ICD-10-CM | POA: Diagnosis not present

## 2022-10-15 DIAGNOSIS — G253 Myoclonus: Secondary | ICD-10-CM | POA: Diagnosis not present

## 2022-10-15 DIAGNOSIS — R739 Hyperglycemia, unspecified: Secondary | ICD-10-CM | POA: Diagnosis not present

## 2022-10-15 DIAGNOSIS — I214 Non-ST elevation (NSTEMI) myocardial infarction: Secondary | ICD-10-CM | POA: Diagnosis not present

## 2022-10-15 DIAGNOSIS — N270 Small kidney, unilateral: Secondary | ICD-10-CM | POA: Diagnosis not present

## 2022-10-15 DIAGNOSIS — R001 Bradycardia, unspecified: Secondary | ICD-10-CM | POA: Diagnosis not present

## 2022-10-15 DIAGNOSIS — R579 Shock, unspecified: Secondary | ICD-10-CM | POA: Diagnosis not present

## 2022-10-15 DIAGNOSIS — E872 Acidosis, unspecified: Secondary | ICD-10-CM | POA: Diagnosis not present

## 2022-10-15 DIAGNOSIS — I498 Other specified cardiac arrhythmias: Secondary | ICD-10-CM | POA: Diagnosis not present

## 2022-10-15 DIAGNOSIS — N390 Urinary tract infection, site not specified: Secondary | ICD-10-CM | POA: Diagnosis not present

## 2022-10-16 DIAGNOSIS — Z515 Encounter for palliative care: Secondary | ICD-10-CM | POA: Diagnosis not present

## 2022-10-16 DIAGNOSIS — I214 Non-ST elevation (NSTEMI) myocardial infarction: Secondary | ICD-10-CM | POA: Diagnosis not present

## 2022-10-16 DIAGNOSIS — X58XXXA Exposure to other specified factors, initial encounter: Secondary | ICD-10-CM | POA: Diagnosis not present

## 2022-10-16 DIAGNOSIS — I469 Cardiac arrest, cause unspecified: Secondary | ICD-10-CM | POA: Diagnosis not present

## 2022-10-16 DIAGNOSIS — J9600 Acute respiratory failure, unspecified whether with hypoxia or hypercapnia: Secondary | ICD-10-CM | POA: Diagnosis not present

## 2022-10-16 DIAGNOSIS — G9340 Encephalopathy, unspecified: Secondary | ICD-10-CM | POA: Diagnosis not present

## 2022-10-16 DIAGNOSIS — E46 Unspecified protein-calorie malnutrition: Secondary | ICD-10-CM | POA: Diagnosis not present

## 2022-10-16 DIAGNOSIS — G40B11 Juvenile myoclonic epilepsy, intractable, with status epilepticus: Secondary | ICD-10-CM | POA: Diagnosis not present

## 2022-10-16 DIAGNOSIS — J9601 Acute respiratory failure with hypoxia: Secondary | ICD-10-CM | POA: Diagnosis not present

## 2022-10-16 DIAGNOSIS — K7200 Acute and subacute hepatic failure without coma: Secondary | ICD-10-CM | POA: Diagnosis not present

## 2022-10-16 DIAGNOSIS — D72829 Elevated white blood cell count, unspecified: Secondary | ICD-10-CM | POA: Diagnosis not present

## 2022-10-16 DIAGNOSIS — I25810 Atherosclerosis of coronary artery bypass graft(s) without angina pectoris: Secondary | ICD-10-CM | POA: Diagnosis not present

## 2022-10-16 DIAGNOSIS — N390 Urinary tract infection, site not specified: Secondary | ICD-10-CM | POA: Diagnosis not present

## 2022-10-16 DIAGNOSIS — R569 Unspecified convulsions: Secondary | ICD-10-CM | POA: Diagnosis not present

## 2022-10-16 DIAGNOSIS — R739 Hyperglycemia, unspecified: Secondary | ICD-10-CM | POA: Diagnosis not present

## 2022-10-16 DIAGNOSIS — N179 Acute kidney failure, unspecified: Secondary | ICD-10-CM | POA: Diagnosis not present

## 2022-10-16 DIAGNOSIS — R579 Shock, unspecified: Secondary | ICD-10-CM | POA: Diagnosis not present

## 2022-10-16 DIAGNOSIS — S2243XA Multiple fractures of ribs, bilateral, initial encounter for closed fracture: Secondary | ICD-10-CM | POA: Diagnosis not present

## 2022-10-17 DIAGNOSIS — R579 Shock, unspecified: Secondary | ICD-10-CM | POA: Diagnosis not present

## 2022-10-17 DIAGNOSIS — R739 Hyperglycemia, unspecified: Secondary | ICD-10-CM | POA: Diagnosis not present

## 2022-10-17 DIAGNOSIS — S2243XA Multiple fractures of ribs, bilateral, initial encounter for closed fracture: Secondary | ICD-10-CM | POA: Diagnosis not present

## 2022-10-17 DIAGNOSIS — G931 Anoxic brain damage, not elsewhere classified: Secondary | ICD-10-CM | POA: Diagnosis not present

## 2022-10-17 DIAGNOSIS — G40B11 Juvenile myoclonic epilepsy, intractable, with status epilepticus: Secondary | ICD-10-CM | POA: Diagnosis not present

## 2022-10-17 DIAGNOSIS — X58XXXA Exposure to other specified factors, initial encounter: Secondary | ICD-10-CM | POA: Diagnosis not present

## 2022-10-17 DIAGNOSIS — N390 Urinary tract infection, site not specified: Secondary | ICD-10-CM | POA: Diagnosis not present

## 2022-10-17 DIAGNOSIS — I214 Non-ST elevation (NSTEMI) myocardial infarction: Secondary | ICD-10-CM | POA: Diagnosis not present

## 2022-10-17 DIAGNOSIS — D72829 Elevated white blood cell count, unspecified: Secondary | ICD-10-CM | POA: Diagnosis not present

## 2022-10-17 DIAGNOSIS — J9601 Acute respiratory failure with hypoxia: Secondary | ICD-10-CM | POA: Diagnosis not present

## 2022-10-17 DIAGNOSIS — I469 Cardiac arrest, cause unspecified: Secondary | ICD-10-CM | POA: Diagnosis not present

## 2022-10-17 DIAGNOSIS — I25810 Atherosclerosis of coronary artery bypass graft(s) without angina pectoris: Secondary | ICD-10-CM | POA: Diagnosis not present

## 2022-10-17 DIAGNOSIS — E46 Unspecified protein-calorie malnutrition: Secondary | ICD-10-CM | POA: Diagnosis not present

## 2022-10-17 DIAGNOSIS — N179 Acute kidney failure, unspecified: Secondary | ICD-10-CM | POA: Diagnosis not present

## 2022-10-17 DIAGNOSIS — G9340 Encephalopathy, unspecified: Secondary | ICD-10-CM | POA: Diagnosis not present

## 2022-10-17 DIAGNOSIS — J9600 Acute respiratory failure, unspecified whether with hypoxia or hypercapnia: Secondary | ICD-10-CM | POA: Diagnosis not present

## 2022-10-17 DIAGNOSIS — K7200 Acute and subacute hepatic failure without coma: Secondary | ICD-10-CM | POA: Diagnosis not present

## 2022-10-18 DIAGNOSIS — J690 Pneumonitis due to inhalation of food and vomit: Secondary | ICD-10-CM | POA: Diagnosis not present

## 2022-10-18 DIAGNOSIS — G9340 Encephalopathy, unspecified: Secondary | ICD-10-CM | POA: Diagnosis not present

## 2022-10-18 DIAGNOSIS — X58XXXA Exposure to other specified factors, initial encounter: Secondary | ICD-10-CM | POA: Diagnosis not present

## 2022-10-18 DIAGNOSIS — S2243XA Multiple fractures of ribs, bilateral, initial encounter for closed fracture: Secondary | ICD-10-CM | POA: Diagnosis not present

## 2022-10-18 DIAGNOSIS — R579 Shock, unspecified: Secondary | ICD-10-CM | POA: Diagnosis not present

## 2022-10-18 DIAGNOSIS — N179 Acute kidney failure, unspecified: Secondary | ICD-10-CM | POA: Diagnosis not present

## 2022-10-18 DIAGNOSIS — R739 Hyperglycemia, unspecified: Secondary | ICD-10-CM | POA: Diagnosis not present

## 2022-10-18 DIAGNOSIS — K7200 Acute and subacute hepatic failure without coma: Secondary | ICD-10-CM | POA: Diagnosis not present

## 2022-10-18 DIAGNOSIS — J9601 Acute respiratory failure with hypoxia: Secondary | ICD-10-CM | POA: Diagnosis not present

## 2022-10-18 DIAGNOSIS — N390 Urinary tract infection, site not specified: Secondary | ICD-10-CM | POA: Diagnosis not present

## 2022-10-18 DIAGNOSIS — I214 Non-ST elevation (NSTEMI) myocardial infarction: Secondary | ICD-10-CM | POA: Diagnosis not present

## 2022-10-18 DIAGNOSIS — E46 Unspecified protein-calorie malnutrition: Secondary | ICD-10-CM | POA: Diagnosis not present

## 2022-10-18 DIAGNOSIS — I25810 Atherosclerosis of coronary artery bypass graft(s) without angina pectoris: Secondary | ICD-10-CM | POA: Diagnosis not present

## 2022-10-18 DIAGNOSIS — J9600 Acute respiratory failure, unspecified whether with hypoxia or hypercapnia: Secondary | ICD-10-CM | POA: Diagnosis not present

## 2022-10-18 DIAGNOSIS — G40B11 Juvenile myoclonic epilepsy, intractable, with status epilepticus: Secondary | ICD-10-CM | POA: Diagnosis not present

## 2022-10-18 DIAGNOSIS — G931 Anoxic brain damage, not elsewhere classified: Secondary | ICD-10-CM | POA: Diagnosis not present

## 2022-10-18 DIAGNOSIS — I469 Cardiac arrest, cause unspecified: Secondary | ICD-10-CM | POA: Diagnosis not present

## 2022-10-18 DIAGNOSIS — D72829 Elevated white blood cell count, unspecified: Secondary | ICD-10-CM | POA: Diagnosis not present

## 2022-10-22 DIAGNOSIS — G931 Anoxic brain damage, not elsewhere classified: Secondary | ICD-10-CM | POA: Diagnosis not present

## 2022-10-22 DIAGNOSIS — J690 Pneumonitis due to inhalation of food and vomit: Secondary | ICD-10-CM | POA: Diagnosis not present

## 2022-10-22 DIAGNOSIS — I469 Cardiac arrest, cause unspecified: Secondary | ICD-10-CM | POA: Diagnosis not present

## 2022-10-22 DIAGNOSIS — I214 Non-ST elevation (NSTEMI) myocardial infarction: Secondary | ICD-10-CM | POA: Diagnosis not present

## 2022-10-22 DIAGNOSIS — J9601 Acute respiratory failure with hypoxia: Secondary | ICD-10-CM | POA: Diagnosis not present

## 2022-10-22 DIAGNOSIS — R579 Shock, unspecified: Secondary | ICD-10-CM | POA: Diagnosis not present

## 2022-10-23 DIAGNOSIS — J690 Pneumonitis due to inhalation of food and vomit: Secondary | ICD-10-CM | POA: Diagnosis not present

## 2022-10-23 DIAGNOSIS — R579 Shock, unspecified: Secondary | ICD-10-CM | POA: Diagnosis not present

## 2022-10-23 DIAGNOSIS — G931 Anoxic brain damage, not elsewhere classified: Secondary | ICD-10-CM | POA: Diagnosis not present

## 2022-10-23 DIAGNOSIS — J9601 Acute respiratory failure with hypoxia: Secondary | ICD-10-CM | POA: Diagnosis not present

## 2022-10-23 DIAGNOSIS — I214 Non-ST elevation (NSTEMI) myocardial infarction: Secondary | ICD-10-CM | POA: Diagnosis not present

## 2022-10-23 DIAGNOSIS — I469 Cardiac arrest, cause unspecified: Secondary | ICD-10-CM | POA: Diagnosis not present

## 2022-10-26 DEATH — deceased

## 2023-04-06 ENCOUNTER — Telehealth: Payer: Self-pay | Admitting: Cardiology

## 2023-04-06 NOTE — Telephone Encounter (Signed)
 Patient's wife called to report patient's passing on November 15, 2022. She is requesting to speak to Dr. Christophe Cram team to thank them for the years of care given to the patient.  Sending email to [facility], as well.

## 2023-04-06 NOTE — Telephone Encounter (Signed)
Called patient's wife left message on personal voice mail to call back.
